# Patient Record
Sex: Male | Born: 1990 | Race: Black or African American | Hispanic: No | Marital: Single | Smoking: Never smoker
Health system: Southern US, Community
[De-identification: ages and names within clinical notes are randomized; demographics above are authoritative.]

## PROBLEM LIST (undated history)

## (undated) DIAGNOSIS — K819 Cholecystitis, unspecified: Secondary | ICD-10-CM

## (undated) SURGERY — ERCP, WITH INTERVENTION IF INDICATED
Anesthesia: Monitor Anesthesia Care

---
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
## 2010-12-17 ENCOUNTER — Emergency Department (HOSPITAL_COMMUNITY)
Admission: EM | Admit: 2010-12-17 | Discharge: 2010-12-17 | Disposition: A | Discharge status: Home / Self Care | Payer: Self-pay | Attending: Emergency Medicine | Admitting: Emergency Medicine

## 2010-12-17 DIAGNOSIS — R350 Frequency of micturition: Secondary | ICD-10-CM | POA: Insufficient documentation

## 2010-12-17 LAB — URINALYSIS, ROUTINE W REFLEX MICROSCOPIC
Nitrite: NEGATIVE
Urobilinogen, UA: 0.2 mg/dL (ref 0.0–1.0)

## 2010-12-17 LAB — CBC
HCT: 41.3 % (ref 39.0–52.0)
Hemoglobin: 15 g/dL (ref 13.0–17.0)
MCHC: 36.3 g/dL — ABNORMAL HIGH (ref 30.0–36.0)
RDW: 13.8 % (ref 11.5–15.5)
WBC: 6.6 10*3/uL (ref 4.0–10.5)

## 2010-12-17 LAB — COMPREHENSIVE METABOLIC PANEL
AST: 64 U/L — ABNORMAL HIGH (ref 0–37)
Albumin: 4.3 g/dL (ref 3.5–5.2)
Alkaline Phosphatase: 106 U/L (ref 39–117)
CO2: 28 mEq/L (ref 19–32)
GFR calc non Af Amer: >60 mL/min (ref >60)
Sodium: 143 mEq/L (ref 135–145)

## 2010-12-17 LAB — DIFFERENTIAL
Basophils Absolute: 0.1 10*3/uL (ref 0.0–0.1)
Eosinophils Absolute: 1 10*3/uL — ABNORMAL HIGH (ref 0.0–0.7)
Lymphocytes Relative: 41 % (ref 12–46)
Lymphs Abs: 2.7 10*3/uL (ref 0.7–4.0)
Monocytes Absolute: 0.4 10*3/uL (ref 0.1–1.0)
Monocytes Relative: 7 % (ref 3–12)
Neutro Abs: 2.4 10*3/uL (ref 1.7–7.7)

## 2010-12-17 LAB — GLUCOSE, CAPILLARY: Glucose-Capillary: 118 mg/dL — ABNORMAL HIGH (ref 70–99)

## 2014-10-25 ENCOUNTER — Other Ambulatory Visit: Payer: Self-pay | Admitting: *Deleted

## 2014-10-25 ENCOUNTER — Other Ambulatory Visit: Payer: Self-pay

## 2014-10-25 DIAGNOSIS — IMO0001 Reserved for inherently not codable concepts without codable children: Secondary | ICD-10-CM

## 2015-03-29 ENCOUNTER — Ambulatory Visit (INDEPENDENT_AMBULATORY_CARE_PROVIDER_SITE_OTHER): Payer: 59 | Admitting: Physician Assistant

## 2015-03-29 VITALS — BP 120/80 | HR 69 | Temp 98.7°F | Resp 16 | Ht 69.0 in | Wt 227.0 lb

## 2015-03-29 DIAGNOSIS — R1011 Right upper quadrant pain: Secondary | ICD-10-CM

## 2015-03-29 NOTE — Progress Notes (Signed)
   Jason Villarreal  MRN: [id number] DOB: [birth year]  Subjective:  Pt presents to clinic RUQ cramping pain. Started 3 days ago and may be getting better but he just wants to make sure that it is nothing that he has to worry about.  When the pain started he also had nausea and vomiting but that has resolved.  He is back to eating and drinking normal.  He is having normal regular soft stools.  There is nothing that he has noticed that makes the pain worse or better.  He has tried nothing to help with the pain.  Yesterday the pain was much better.  He does not drink ETOH.  There are no active problems to display for this patient.   No current outpatient prescriptions on file prior to visit.   No current facility-administered medications on file prior to visit.    Allergies  Allergen Reactions  . Penicillins     Per mom since childhood    Review of Systems  Constitutional: Negative for fever and chills.  Gastrointestinal: Positive for vomiting (3 days ago - now resolved) and abdominal pain. Negative for nausea, diarrhea and constipation.  Genitourinary: Negative.    Objective:  BP 120/80 mmHg  Pulse 69  Temp(Src) 98.7 F (37.1 C) (Oral)  Resp 16  Ht  (1.753 m)  Wt 227 lb (102.967 kg)  BMI 33.51 kg/m2  SpO2 98%  Physical Exam  Constitutional: He is oriented to person, place, and time and well-developed, well-nourished, and in no distress.  HENT:  Head: Normocephalic and atraumatic.  Right Ear: External ear normal.  Left Ear: External ear normal.  Eyes: Conjunctivae are normal.  Non-icteric  Neck: Normal range of motion.  Cardiovascular: Normal rate, regular rhythm and normal heart sounds.   Pulmonary/Chest: Effort normal and breath sounds normal.  Abdominal: Soft. Bowel sounds are normal. There is no hepatosplenomegaly. There is tenderness (mild discomfort) in the right upper quadrant. There is no rigidity, no rebound, no guarding, no CVA tenderness and negative  Murphy's sign.  Neurological: He is alert and oriented to person, place, and time. Gait normal.  Skin: Skin is warm and dry.  No jaundice  Psychiatric: Mood, memory, affect and judgment normal.    Assessment and Plan :  RUQ pain  - most likely related to GI illness that seems to be improving.  Pt's exam is unremarkable and we discussed options - he would like to watch and wait.  If he is not improving over the next 3-5 days he will RTC for labs.  Benny Lennert PA-C  [facility] 03/29/2015 9:40 AM

## 2015-03-29 NOTE — Patient Instructions (Signed)
Drink enough water. Make sure your stool stays soft.  Recheck if the pain changes or does not resolve.

## 2015-04-02 ENCOUNTER — Emergency Department (INDEPENDENT_AMBULATORY_CARE_PROVIDER_SITE_OTHER)
Admission: EM | Admit: 2015-04-02 | Discharge: 2015-04-02 | Disposition: A | Discharge status: Home / Self Care | Payer: 59 | Source: Home / Self Care | Attending: Emergency Medicine | Admitting: Emergency Medicine

## 2015-04-02 ENCOUNTER — Encounter (HOSPITAL_COMMUNITY): Payer: Self-pay | Admitting: Emergency Medicine

## 2015-04-02 ENCOUNTER — Emergency Department (INDEPENDENT_AMBULATORY_CARE_PROVIDER_SITE_OTHER): Payer: 59

## 2015-04-02 DIAGNOSIS — R1011 Right upper quadrant pain: Secondary | ICD-10-CM

## 2015-04-02 DIAGNOSIS — K5901 Slow transit constipation: Secondary | ICD-10-CM

## 2015-04-02 NOTE — ED Notes (Signed)
Lower abdominal pain, onset Sunday 9/18

## 2015-04-02 NOTE — ED Provider Notes (Signed)
CSN: [id number]     Arrival date & time 04/02/15  1853 History   First MD Initiated Contact with Patient 04/02/15 1906     Chief Complaint  Patient presents with  . Abdominal Pain   (Consider location/radiation/quality/duration/timing/severity/associated sxs/prior Treatment) HPI Comments: 24 year old male states that approximately 8 days ago he developed insidious onset of right upper quadrant pain. The pain has been constant and occasionally radiates to the right lower back. He describes it as a throbbing type pain it is worse when lying on the right side. Nothing else makes it worse. He states it is constant. Nothing makes it better. There is been no associated bleeding. He was seen by another urgent care his evaluation determined likely from a gas or stool type problem. He was advised that if the pain continued or develop new symptoms or problems to return for additional evaluation to include lab work. He said he was too busy Friday and Saturday so he waited till today to come to the urgent care. The pain got worse yesterday but states he had too many errands to run to get checked. He rates it as a 7/10.   History reviewed. No pertinent past medical history. History reviewed. No pertinent past surgical history. Family History  Problem Relation Age of Onset  . Diabetes Father    Social History  Substance Use Topics  . Smoking status: Never Smoker   . Smokeless tobacco: None  . Alcohol Use: No    Review of Systems  Constitutional: Negative for fever, activity change and fatigue.  HENT: Negative.   Respiratory: Negative for cough, choking, chest tightness and shortness of breath.   Cardiovascular: Negative for chest pain and palpitations.  Gastrointestinal: Positive for abdominal pain. Negative for nausea, vomiting, diarrhea, constipation and blood in stool.  Genitourinary: Negative.   Musculoskeletal: Positive for back pain. Negative for myalgias, joint swelling and gait problem.   Skin: Negative for color change and rash.  Neurological: Negative.   All other systems reviewed and are negative.   Allergies  Penicillins  Home Medications   Prior to Admission medications   Not on File   Meds Ordered and Administered this Visit  Medications - No data to display  BP 91/53 mmHg  Pulse 57  Temp(Src) 98.3 F (36.8 C) (Oral)  Resp 18  SpO2 100% No data found.   Physical Exam  Constitutional: He is oriented to person, place, and time. He appears well-developed and well-nourished. No distress.  Appears generally well. Is in no acute distress. Relaxed posturing. No facial expression of pain or distress. Voice is calm and relaxed  Neck: Normal range of motion. Neck supple.  Cardiovascular: Normal rate.   Pulmonary/Chest: Effort normal and breath sounds normal. No respiratory distress.  Abdominal: Soft. Bowel sounds are normal. He exhibits no distension and no mass.  Tenderness in a small area of the right upper quadrant. Percussion reveals generalized dullness. No rebound. Some guarding with initial palpation of the right upper quadrant. No tenderness to the left upper quadrant or the lower abdomen.  Musculoskeletal: Normal range of motion. He exhibits no edema.  Neurological: He is alert and oriented to person, place, and time. He exhibits normal muscle tone.  Skin: Skin is warm and dry.  Psychiatric: He has a normal mood and affect.  Nursing note and vitals reviewed.   ED Course  Procedures (including critical care time)  Labs Review Labs Reviewed - No data to display  Imaging Review Dg Abd 1 View  04/02/2015   CLINICAL DATA:  Right upper quadrant pain for 1 week with nausea.  EXAM: ABDOMEN - 1 VIEW  COMPARISON:  None.  FINDINGS: The bowel gas pattern is normal. No radio-opaque calculi or other significant radiographic abnormality are seen.  IMPRESSION: Negative exam.   Electronically Signed   By: Drusilla Kanner M.D.   On: 04/02/2015 19:38      Visual Acuity Review  Right Eye Distance:   Left Eye Distance:   Bilateral Distance:    Right Eye Near:   Left Eye Near:    Bilateral Near:         MDM   1. Right upper quadrant pain   2. Slow transit constipation    Try the Miralax as directed. Start with 2 glasses/doses tonight. May need to repeat tomorrow depending on results.  If not improving or worse see your doctor or go to the ED  Xray with large stool mass RUQ and stool throughout the large colon. Abd with no peritoneal signs. Pt stable, no distress, minimal abd discomfort.  Hayden Rasmussen, NP 04/02/15 2001

## 2015-04-02 NOTE — Discharge Instructions (Signed)
Constipation Try the Miralax as directed. Start with 2 glasses/doses tonight. May need to repeat tomorrow depending on results.  If not improving or worse see your doctor or go to the ED Constipation is when a person has fewer than three bowel movements a week, has difficulty having a bowel movement, or has stools that are dry, hard, or larger than normal. As people grow older, constipation is more common. If you try to fix constipation with medicines that make you have a bowel movement (laxatives), the problem may get worse. Long-term laxative use may cause the muscles of the colon to become weak. A low-fiber diet, not taking in enough fluids, and taking certain medicines may make constipation worse.  CAUSES   Certain medicines, such as antidepressants, pain medicine, iron supplements, antacids, and water pills.   Certain diseases, such as diabetes, irritable bowel syndrome (IBS), thyroid disease, or depression.   Not drinking enough water.   Not eating enough fiber-rich foods.   Stress or travel.   Lack of physical activity or exercise.   Ignoring the urge to have a bowel movement.   Using laxatives too much.  SIGNS AND SYMPTOMS   Having fewer than three bowel movements a week.   Straining to have a bowel movement.   Having stools that are hard, dry, or larger than normal.   Feeling full or bloated.   Pain in the lower abdomen.   Not feeling relief after having a bowel movement.  DIAGNOSIS  Your health care provider will take a medical history and perform a physical exam. Further testing may be done for severe constipation. Some tests may include:  A barium enema X-ray to examine your rectum, colon, and, sometimes, your small intestine.   A sigmoidoscopy to examine your lower colon.   A colonoscopy to examine your entire colon. TREATMENT  Treatment will depend on the severity of your constipation and what is causing it. Some dietary treatments include  drinking more fluids and eating more fiber-rich foods. Lifestyle treatments may include regular exercise. If these diet and lifestyle recommendations do not help, your health care provider may recommend taking over-the-counter laxative medicines to help you have bowel movements. Prescription medicines may be prescribed if over-the-counter medicines do not work.  HOME CARE INSTRUCTIONS   Eat foods that have a lot of fiber, such as fruits, vegetables, whole grains, and beans.  Limit foods high in fat and processed sugars, such as french fries, hamburgers, cookies, candies, and soda.   A fiber supplement may be added to your diet if you cannot get enough fiber from foods.   Drink enough fluids to keep your urine clear or pale yellow.   Exercise regularly or as directed by your health care provider.   Go to the restroom when you have the urge to go. Do not hold it.   Only take over-the-counter or prescription medicines as directed by your health care provider. Do not take other medicines for constipation without talking to your health care provider first.  SEEK IMMEDIATE MEDICAL CARE IF:   You have bright red blood in your stool.   Your constipation lasts for more than 4 days or gets worse.   You have abdominal or rectal pain.   You have thin, pencil-like stools.   You have unexplained weight loss. MAKE SURE YOU:   Understand these instructions.  Will watch your condition.  Will get help right away if you are not doing well or get worse. Document Released: 03/22/2004 Document Revised:  06/29/2013 Document Reviewed: 04/05/2013 Exit[employer] Patient Information 2015 [place], [place]. This information is not intended to replace advice given to you by your health care provider. Make sure you discuss any questions you have with your health care provider.  Abdominal Pain Many things can cause abdominal pain. Usually, abdominal pain is not caused by a disease and will improve without  treatment. It can often be observed and treated at home. Your health care provider will do a physical exam and possibly order blood tests and X-rays to help determine the seriousness of your pain. However, in many cases, more time must pass before a clear cause of the pain can be found. Before that point, your health care provider may not know if you need more testing or further treatment. HOME CARE INSTRUCTIONS  Monitor your abdominal pain for any changes. The following actions may help to alleviate any discomfort you are experiencing:  Only take over-the-counter or prescription medicines as directed by your health care provider.  Do not take laxatives unless directed to do so by your health care provider.  Try a clear liquid diet (broth, tea, or water) as directed by your health care provider. Slowly move to a bland diet as tolerated. SEEK MEDICAL CARE IF:  You have unexplained abdominal pain.  You have abdominal pain associated with nausea or diarrhea.  You have pain when you urinate or have a bowel movement.  You experience abdominal pain that wakes you in the night.  You have abdominal pain that is worsened or improved by eating food.  You have abdominal pain that is worsened with eating fatty foods.  You have a fever. SEEK IMMEDIATE MEDICAL CARE IF:   Your pain does not go away within 2 hours.  You keep throwing up (vomiting).  Your pain is felt only in portions of the abdomen, such as the right side or the left lower portion of the abdomen.  You pass bloody or black tarry stools. MAKE SURE YOU:  Understand these instructions.   Will watch your condition.   Will get help right away if you are not doing well or get worse.  Document Released: 04/03/2005 Document Revised: 06/29/2013 Document Reviewed: 03/03/2013 [facility] Patient Information 2015 [place], [place]. This information is not intended to replace advice given to you by your health care provider. Make sure you  discuss any questions you have with your health care provider.

## 2015-04-13 ENCOUNTER — Telehealth: Payer: Self-pay

## 2015-04-13 ENCOUNTER — Ambulatory Visit (INDEPENDENT_AMBULATORY_CARE_PROVIDER_SITE_OTHER): Payer: 59 | Admitting: Family Medicine

## 2015-04-13 VITALS — BP 98/76 | HR 97 | Temp 98.5°F | Resp 16 | Ht 69.5 in | Wt 219.0 lb

## 2015-04-13 DIAGNOSIS — K5901 Slow transit constipation: Secondary | ICD-10-CM | POA: Diagnosis not present

## 2015-04-13 DIAGNOSIS — R112 Nausea with vomiting, unspecified: Secondary | ICD-10-CM | POA: Diagnosis not present

## 2015-04-13 DIAGNOSIS — R1011 Right upper quadrant pain: Secondary | ICD-10-CM | POA: Diagnosis not present

## 2015-04-13 LAB — POCT CBC
Granulocyte percent: 70.5 %G (ref 37–80)
HEMATOCRIT: 46.3 % (ref 43.5–53.7)
HEMOGLOBIN: 15 g/dL (ref 14.1–18.1)
LYMPH, POC: 1.5 (ref 0.6–3.4)
MCH: 25.7 pg — AB (ref 27–31.2)
MCHC: 32.3 g/dL (ref 31.8–35.4)
MCV: 79.4 fL — AB (ref 80–97)
MID (cbc): 0.6 (ref 0–0.9)
MPV: 8.6 fL (ref 0–99.8)
POC GRANULOCYTE: 5.1 (ref 2–6.9)
POC LYMPH PERCENT: 20.5 %L (ref 10–50)
POC MID %: 9 %M (ref 0–12)
Platelet Count, POC: 359 10*3/uL (ref 142–424)
RBC: 5.83 M/uL (ref 4.69–6.13)
RDW, POC: 14.2 %
WBC: 7.2 10*3/uL (ref 4.6–10.2)

## 2015-04-13 LAB — POCT URINALYSIS DIP (MANUAL ENTRY)
GLUCOSE UA: NEGATIVE
LEUKOCYTES UA: NEGATIVE
NITRITE UA: NEGATIVE
Protein Ur, POC: NEGATIVE
Spec Grav, UA: <=1.005
UROBILINOGEN UA: 0.2
pH, UA: 6.5

## 2015-04-13 LAB — COMPREHENSIVE METABOLIC PANEL
ALBUMIN: 5.1 g/dL (ref 3.6–5.1)
ALK PHOS: 334 U/L — AB (ref 40–115)
ALT: 569 U/L — AB (ref 9–46)
AST: 381 U/L — AB (ref 10–40)
BILIRUBIN TOTAL: 8.1 mg/dL — AB (ref 0.2–1.2)
BUN: 6 mg/dL — ABNORMAL LOW (ref 7–25)
CALCIUM: 10.4 mg/dL — AB (ref 8.6–10.3)
CO2: 34 mmol/L — ABNORMAL HIGH (ref 20–31)
CREATININE: 1.09 mg/dL (ref 0.60–1.35)
Chloride: 93 mmol/L — ABNORMAL LOW (ref 98–110)
Glucose, Bld: 91 mg/dL (ref 65–99)
Potassium: 4.1 mmol/L (ref 3.5–5.3)
SODIUM: 137 mmol/L (ref 135–146)
TOTAL PROTEIN: 8.3 g/dL — AB (ref 6.1–8.1)

## 2015-04-13 LAB — POC MICROSCOPIC URINALYSIS (UMFC): MUCUS RE: ABSENT

## 2015-04-13 LAB — LIPASE: LIPASE: 14 U/L (ref 7–60)

## 2015-04-13 MED ORDER — ONDANSETRON 4 MG PO TBDP: 4.0000 mg | ORAL_TABLET | Freq: Three times a day (TID) | ORAL | Status: DC | PRN

## 2015-04-13 MED ORDER — DICYCLOMINE HCL 10 MG PO CAPS: ORAL_CAPSULE | ORAL | Status: DC

## 2015-04-13 NOTE — Progress Notes (Signed)
Patient ID: CHER EGNOR, male    DOB: [birth year]  Age: 24 y.o. MRN: [id number]  Chief Complaint  Patient presents with  . Follow-up    right sided abd pain-not any better  . Vomiting    x 2 days    Subjective:   24 year old man who is here complaining of right upper quadrant abdominal pain. He was here several weeks ago and evaluated. Was felt to have been a transient thing that it mostly past by then. Later he had more problems and went to another physician. An x-ray of the abdomen was done and he was told he was constipated. He took some MiraLAX. His bowels still don't remove regularly. He did not have a BM yesterday. He has had nausea and some vomiting. He hurts in the right upper quadrant. It hurts everyday fairly persistently. He has not been eating much because of it. He hurts in his right flank area at times. He's not been running any fever. He has no dysuria but his urine is been dark. His bowels do not move yesterday. He has never had any surgeries on his abdomen. There is no family history of gallbladder disease.  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 He mostly eats out for his meals.  Current allergies, medications, problem list, past/family and social histories reviewed.  Objective:  BP 98/76 mmHg  Pulse 97  Temp(Src) 98.5 F (36.9 C)  Resp 16  Ht 5' 9.5" (1.765 m)  Wt 219 lb (99.338 kg)  BMI 31.89 kg/m2  SpO2 98%  Moderately overweight young man in no acute distress. Chest clear. Heart regular without murmurs. Abdomen soft without masses. Mild right quadrant tenderness. No rebound. No CVA tenderness. Genitorectal exam not done.  Assessment & Plan:   Assessment: 1. Continuous RUQ abdominal pain   2. Slow transit constipation   3. Non-intractable vomiting with nausea, unspecified vomiting type       Plan:  Check CBC and urine. Results for orders placed or performed in visit on 04/13/15  POCT CBC   Result Value Ref Range   WBC 7.2 4.6 - 10.2 K/uL   Lymph, poc 1.5 0.6 - 3.4   POC LYMPH PERCENT 20.5 10 - 50 %L   MID (cbc) 0.6 0 - 0.9   POC MID % 9.0 0 - 12 %M   POC Granulocyte 5.1 2 - 6.9   Granulocyte percent 70.5 37 - 80 %G   RBC 5.83 4.69 - 6.13 M/uL   Hemoglobin 15.0 14.1 - 18.1 g/dL   HCT, POC 78.4 69.6 - 53.7 %   MCV 79.4 (A) 80 - 97 fL   MCH, POC 25.7 (A) 27 - 31.2 pg   MCHC 32.3 31.8 - 35.4 g/dL   RDW, POC 29.5 %   Platelet Count, POC 359 142 - 424 K/uL   MPV 8.6 0 - 99.8 fL  POCT Microscopic Urinalysis ([facility])  Result Value Ref Range   WBC,UR,HPF,POC None None WBC/hpf   RBC,UR,HPF,POC None None RBC/hpf   Bacteria None None   Mucus Absent Absent   Epithelial Cells, UR Per Microscopy None None cells/hpf  POCT urinalysis dipstick  Result Value Ref Range   Color, UA other (A) yellow   Clarity, UA clear clear   Glucose, UA negative negative   Bilirubin, UA moderate (A) negative   Ketones, POC UA moderate (40) (A) negative   Spec Grav, UA <=1.005    Blood, UA trace-intact (A)  negative   pH, UA 6.5    Protein Ur, POC negative negative   Urobilinogen, UA 0.2    Nitrite, UA Negative Negative   Leukocytes, UA Negative Negative        Patient Instructions  Drink more fluids. You are getting dehydrated  Your additional labs will be back in the next day or 2 and I will let you know the results of them  Dehydration will aggravate her constipation also. It is important that in addition to drinking more fluids you go ahead and take the MiraLAX if you have not had a bowel movement to keep your bowels moving.  Take dicyclomine 10 mg before meals and at bedtime if needed for pain  You're being scheduled for a gallbladder ultrasound which should be done in the next several days. If you get appropriate worse in the meanwhile before then please slow.  Take ondansetron 4 mg every 6 or 8 hours if needed for nausea or vomiting  If acutely worse at any time either return  here or go to the emergency room.  If not improved in the next week to return for a recheck.     No Follow-up on file.   HOPPER,DAVID, MD 04/13/2015

## 2015-04-13 NOTE — Telephone Encounter (Signed)
Tried to call Pt. No answer,left VM to call back. Dr. Quintella Reichert wanted him to know that his liver test shows some high elevation and he suspects that his gallbladder is infected and wants him to go to the ER today.

## 2015-04-13 NOTE — Patient Instructions (Signed)
Drink more fluids. You are getting dehydrated  Your additional labs will be back in the next day or 2 and I will let you know the results of them  Dehydration will aggravate her constipation also. It is important that in addition to drinking more fluids you go ahead and take the MiraLAX if you have not had a bowel movement to keep your bowels moving.  Take dicyclomine 10 mg before meals and at bedtime if needed for pain  You're being scheduled for a gallbladder ultrasound which should be done in the next several days. If you get appropriate worse in the meanwhile before then please slow.  Take ondansetron 4 mg every 6 or 8 hours if needed for nausea or vomiting  If acutely worse at any time either return here or go to the emergency room.  If not improved in the next week to return for a recheck.

## 2015-04-14 ENCOUNTER — Emergency Department (HOSPITAL_COMMUNITY): Payer: 59

## 2015-04-14 ENCOUNTER — Encounter (HOSPITAL_COMMUNITY): Payer: Self-pay | Admitting: Emergency Medicine

## 2015-04-14 ENCOUNTER — Inpatient Hospital Stay (HOSPITAL_COMMUNITY)
Admission: EM | Admit: 2015-04-14 | Discharge: 2015-04-18 | DRG: 419 | Disposition: A | Discharge status: Home / Self Care | Payer: 59 | Attending: General Surgery | Admitting: General Surgery

## 2015-04-14 DIAGNOSIS — R000 Tachycardia, unspecified: Secondary | ICD-10-CM | POA: Diagnosis not present

## 2015-04-14 DIAGNOSIS — Z880 Allergy status to penicillin: Secondary | ICD-10-CM | POA: Diagnosis not present

## 2015-04-14 DIAGNOSIS — Z833 Family history of diabetes mellitus: Secondary | ICD-10-CM

## 2015-04-14 DIAGNOSIS — Z79899 Other long term (current) drug therapy: Secondary | ICD-10-CM | POA: Diagnosis not present

## 2015-04-14 DIAGNOSIS — R739 Hyperglycemia, unspecified: Secondary | ICD-10-CM | POA: Diagnosis present

## 2015-04-14 DIAGNOSIS — K8066 Calculus of gallbladder and bile duct with acute and chronic cholecystitis without obstruction: Secondary | ICD-10-CM | POA: Diagnosis not present

## 2015-04-14 DIAGNOSIS — R1011 Right upper quadrant pain: Secondary | ICD-10-CM | POA: Diagnosis present

## 2015-04-14 DIAGNOSIS — K8042 Calculus of bile duct with acute cholecystitis without obstruction: Secondary | ICD-10-CM

## 2015-04-14 DIAGNOSIS — Z9049 Acquired absence of other specified parts of digestive tract: Secondary | ICD-10-CM | POA: Diagnosis present

## 2015-04-14 DIAGNOSIS — K8050 Calculus of bile duct without cholangitis or cholecystitis without obstruction: Secondary | ICD-10-CM

## 2015-04-14 DIAGNOSIS — K8020 Calculus of gallbladder without cholecystitis without obstruction: Secondary | ICD-10-CM

## 2015-04-14 LAB — COMPREHENSIVE METABOLIC PANEL
ALBUMIN: 4.7 g/dL (ref 3.5–5.0)
ALK PHOS: 360 U/L — AB (ref 38–126)
ALT: 501 U/L — ABNORMAL HIGH (ref 17–63)
ANION GAP: 12 (ref 5–15)
AST: 298 U/L — ABNORMAL HIGH (ref 15–41)
BILIRUBIN TOTAL: 7.2 mg/dL — AB (ref 0.3–1.2)
BUN: 5 mg/dL — AB (ref 6–20)
CALCIUM: 9.6 mg/dL (ref 8.9–10.3)
CO2: 27 mmol/L (ref 22–32)
Chloride: 95 mmol/L — ABNORMAL LOW (ref 101–111)
Creatinine, Ser: 0.81 mg/dL (ref 0.61–1.24)
GFR calc Af Amer: >60 mL/min (ref >60)
GLUCOSE: 180 mg/dL — AB (ref 65–99)
Potassium: 3.6 mmol/L (ref 3.5–5.1)
Sodium: 134 mmol/L — ABNORMAL LOW (ref 135–145)
TOTAL PROTEIN: 8.4 g/dL — AB (ref 6.5–8.1)

## 2015-04-14 LAB — URINALYSIS, ROUTINE W REFLEX MICROSCOPIC
GLUCOSE, UA: NEGATIVE mg/dL
HGB URINE DIPSTICK: NEGATIVE
KETONES UR: NEGATIVE mg/dL
Leukocytes, UA: NEGATIVE
Nitrite: NEGATIVE
PROTEIN: NEGATIVE mg/dL
Specific Gravity, Urine: 1.006 (ref 1.005–1.030)
Urobilinogen, UA: 0.2 mg/dL (ref 0.0–1.0)
pH: 7 (ref 5.0–8.0)

## 2015-04-14 LAB — CBC
HCT: 40.9 % (ref 39.0–52.0)
HEMOGLOBIN: 15 g/dL (ref 13.0–17.0)
MCH: 27.4 pg (ref 26.0–34.0)
MCHC: 36.7 g/dL — AB (ref 30.0–36.0)
MCV: 74.8 fL — ABNORMAL LOW (ref 78.0–100.0)
Platelets: 349 10*3/uL (ref 150–400)
RBC: 5.47 MIL/uL (ref 4.22–5.81)
RDW: 13.4 % (ref 11.5–15.5)
WBC: 10.2 10*3/uL (ref 4.0–10.5)

## 2015-04-14 LAB — BILIRUBIN, DIRECT: BILIRUBIN DIRECT: 4.1 mg/dL — AB (ref 0.1–0.5)

## 2015-04-14 LAB — LIPASE, BLOOD: Lipase: 25 U/L (ref 22–51)

## 2015-04-14 MED ORDER — HYDROMORPHONE HCL 1 MG/ML IJ SOLN
1.0000 mg | Freq: Once | INTRAMUSCULAR | Status: AC
  Administered 2015-04-14: 1 mg via INTRAVENOUS
  Filled 2015-04-14: qty 1

## 2015-04-14 MED ORDER — SODIUM CHLORIDE 0.9 % IV BOLUS (SEPSIS)
1000.0000 mL | Freq: Once | INTRAVENOUS | Status: AC
  Administered 2015-04-14: 1000 mL via INTRAVENOUS

## 2015-04-14 MED ORDER — ONDANSETRON HCL 4 MG/2ML IJ SOLN
4.0000 mg | Freq: Once | INTRAMUSCULAR | Status: AC
  Administered 2015-04-14: 4 mg via INTRAVENOUS
  Filled 2015-04-14: qty 2

## 2015-04-14 MED ORDER — MORPHINE SULFATE (PF) 4 MG/ML IV SOLN
4.0000 mg | Freq: Once | INTRAVENOUS | Status: AC
  Administered 2015-04-14: 4 mg via INTRAVENOUS
  Filled 2015-04-14: qty 1

## 2015-04-14 NOTE — ED Notes (Signed)
Pt reports RUQ abd pain for the past 3 weeks. Pt went to UC and was told he had elevated liver enzymes. Was told to come here due to concern for gallbladder issues.

## 2015-04-14 NOTE — ED Notes (Signed)
Pt reports occasional bursts of pain but states that right now it is "manageable" and denies the need for additional pain medication at this time.  He is resting comfortably in bed with family present at the bedside.  No acute distress noted.

## 2015-04-14 NOTE — ED Provider Notes (Signed)
CSN: [id number]     Arrival date & time 04/14/15  1555 History   First MD Initiated Contact with Patient 04/14/15 1729     Chief Complaint  Patient presents with  . Abdominal Pain     (Consider location/radiation/quality/duration/timing/severity/associated sxs/prior Treatment) HPI Comments: 24 year old male who presents with right upper quadrant pain. Patient states that he has had 3 weeks of worsening right upper quadrant pain that is now constant, severe, and radiates to his back and right shoulder. Nothing makes the pain better or worse. He denies any association of the pain with eating. He was evaluated several weeks ago and diagnosed with constipation. He has taken some medications to treat constipation but his pain is not improved. He went to an urgent care recently and was later called about abnormal labs-- he was told that his liver enzymes were elevated. He denies any significant alcohol or Tylenol use. No fevers or cough/cold symptoms. He has had several episodes of vomiting, most recently this morning. No diarrhea or blood in his stool. Mild constipation.  Patient is a 24 y.o. male presenting with abdominal pain. The history is provided by the patient.  Abdominal Pain   History reviewed. No pertinent past medical history. History reviewed. No pertinent past surgical history. Family History  Problem Relation Age of Onset  . Diabetes Father    Social History  Substance Use Topics  . Smoking status: Never Smoker   . Smokeless tobacco: None  . Alcohol Use: No    Review of Systems  Gastrointestinal: Positive for abdominal pain.    10 Systems reviewed and are negative for acute change except as noted in the HPI.   Allergies  Penicillins  Home Medications   Prior to Admission medications   Medication Sig Start Date End Date Taking? Authorizing Provider  dicyclomine (BENTYL) 10 MG capsule Take 1 pill before meals and at bedtime only if needed for abdominal pain. 04/13/15   Yes Peyton Najjar, MD  ondansetron (ZOFRAN ODT) 4 MG disintegrating tablet Take 1 tablet (4 mg total) by mouth every 8 (eight) hours as needed for nausea or vomiting. 04/13/15  Yes Peyton Najjar, MD   BP 106/55 mmHg  Pulse 91  Temp(Src) 98.1 F (36.7 C) (Oral)  Resp 16  SpO2 98% Physical Exam  Constitutional: He is oriented to person, place, and time. He appears well-developed and well-nourished.  In moderate distress due to pain  HENT:  Head: Normocephalic and atraumatic.  Mouth/Throat: Oropharynx is clear and moist.  Moist mucous membranes  Eyes: Pupils are equal, round, and reactive to light.  Scleral icterus  Neck: Neck supple.  Cardiovascular: Normal rate, regular rhythm and normal heart sounds.   No murmur heard. Pulmonary/Chest: Effort normal and breath sounds normal.  Abdominal: Soft. Bowel sounds are normal. He exhibits no distension.  Tenderness to palpation of RUQ w/ mild voluntary guarding; +hepatomegaly w/ liver edge felt 3-4cm below costal margin; no peritonitis  Musculoskeletal: He exhibits no edema.  Neurological: He is alert and oriented to person, place, and time.  Fluent speech  Skin: Skin is warm and dry.  Psychiatric: He has a normal mood and affect. Judgment normal.  Nursing note and vitals reviewed.   ED Course  Procedures (including critical care time) Labs Review Labs Reviewed  COMPREHENSIVE METABOLIC PANEL - Abnormal; Notable for the following:    Sodium 134 (*)    Chloride 95 (*)    Glucose, Bld 180 (*)    BUN 5 (*)  Total Protein 8.4 (*)    AST 298 (*)    ALT 501 (*)    Alkaline Phosphatase 360 (*)    Total Bilirubin 7.2 (*)    All other components within normal limits  CBC - Abnormal; Notable for the following:    MCV 74.8 (*)    MCHC 36.7 (*)    All other components within normal limits  BILIRUBIN, DIRECT - Abnormal; Notable for the following:    Bilirubin, Direct 4.1 (*)    All other components within normal limits  LIPASE, BLOOD   URINALYSIS, ROUTINE W REFLEX MICROSCOPIC (NOT AT [facility])    Imaging Review US Abdomen Complete  04/14/2015   CLINICAL DATA:  Right upper quadrant pain and increased liver function tests for 3 weeks.  EXAM: ULTRASOUND ABDOMEN COMPLETE  COMPARISON:  None.  FINDINGS: Gallbladder: The gallbladder is normally distended, partially filled with multiple mobile shadowing gallstones measuring up to 6 mm. The gallbladder wall is thickened and edematous measuring up to 6 mm. Sonographic Murphy's sign is positive.  Common bile duct: Diameter: Enlarged to 8.5 mm.  Liver: No focal lesion identified. Within normal limits in parenchymal echogenicity.  IVC: No abnormality visualized.  Pancreas: Visualized portion unremarkable, however partially obscured by bowel gas.  Spleen: Size and appearance within normal limits.  Right Kidney: Length: 11.9 cm. Echogenicity within normal limits. No mass or hydronephrosis visualized.  Left Kidney: Length: 11.4 cm. Echogenicity within normal limits. No mass or hydronephrosis visualized.  Abdominal aorta: Was not seen.  Other findings: None.  IMPRESSION: Cholelithiasis with evidence of acute cholecystitis.  Dilated common bile duct of 8.5 mm, which raises the possibility of concomitant choledocholithiasis.  These results were called by telephone at the time of interpretation on 04/14/2015 at 7:46 pm to Dr. Frederick Peers , who verbally acknowledged these results.   Electronically Signed   By: Ted Mcalpine M.D.   On: 04/14/2015 19:51   I have personally reviewed and evaluated these lab results as part of my medical decision-making.   EKG Interpretation None     Medications  sodium chloride 0.9 % bolus 1,000 mL (1,000 mLs Intravenous New Bag/Given 04/14/15 1824)  ondansetron (ZOFRAN) injection 4 mg (4 mg Intravenous Given 04/14/15 1824)  morphine 4 MG/ML injection 4 mg (4 mg Intravenous Given 04/14/15 1824)    MDM   Final diagnoses:  Choledocholithiasis with acute cholecystitis    24 year old male who presents with 3 weeks of worsening right upper quadrant pain associated with some diarrhea. Patient uncomfortable and in moderate distress due to pain at presentation. Vital signs unremarkable. He had significant right upper quadrant tenderness as well as hepatomegaly on exam. Obtained above labs and gave the patient Zofran, morphine, and an IV fluid bolus. Later gave several doses of dilaudid for ongoing pain.  Labs notable for AST 298, ALT 501, alkaline phosphatase 360, total bilirubin 7.2, direct bilirubin 4.1. Obtained right upper quadrant ultrasound due to concern for obstructive biliary process. RUQ showed acute cholecystitis with dilated common bile duct at 8.5 mm, suggestive of choledocholithiasis. I consult did Dr. Ezzard Standing with general surgery as well as Dr. Loreta Ave from gastroenterology. I appreciate their assistance with patient's care. Dr. Ezzard Standing will admit patient and Dr. Loreta Ave has placed patient on consult list for consideration of ERCP in the morning. Pt will be held NPO at midnight. Pt admitted for further care.    Laurence Spates, MD 04/14/15 [phone]

## 2015-04-14 NOTE — Progress Notes (Signed)
Patient listed as having [employer] insurance without a pcp.  EDCM spoke to patient at bedside.  [facility] provided patient with list of pcps who accept [facility] insurance within a 10 mile radius of patient's zip code [address]. Patient thankful for resources.  No further EDCM needs at this time.

## 2015-04-14 NOTE — H&P (Addendum)
Re:   Jason Villarreal DOB:   May 06, 1991 MRN:   [id number]   [facility] Admission  ASSESSMENT AND PLAN: 1.  Cholelithiasis with probable cholecystitis  Plan admission, NPO p MN, IVF, and repeat labs in AM.  Will need ERCP first - the cholecystectomy.   I discussed with the patient the indications and risks of gall bladder surgery.  The primary risks of gall bladder surgery include, but are not limited to, bleeding, infection, common bile duct injury, and open surgery.  There is also the risk that the patient may have continued symptoms after surgery.  We discussed the typical post-operative recovery course. I tried to answer the patient's questions.  He will need the ERCP first.  2.  Probable choledocholithiasis  Dr. Rex Kras to talk to GI on call ([facility]) to consider ERCP.  Chief Complaint  Patient presents with  . Abdominal Pain   REFERRING PHYSICIAN:  Dr. Theotis Burrow, [facility] ER  HISTORY OF PRESENT ILLNESS: Jason Villarreal is a 24 y.o. (DOB: 05-19-1991)  AA  male whose primary care physician is [name]. and comes to the [facility] ER today for abdominal pain. His girlfriend, Lance Coon, is with him.   He has had a 3 week history of right upper quadrant abdominal pain.  He has also had nausea and vomiting.  He has not had much of an appetite.  He noticed about 3 or 4 days ago, his urine turned dark.  He has no prior history of stomach, liver, pancreas, or colon disease.  He has had no prior abdominal surgery.  He thinks that a cousin had gall bladder disease, but no other family members.  He went to [facility] care yesterday.  They thought he had constipation, but when his labs came back with elevated LFT's, he was instructed to go to the [facility].  Abdominal US - 04/14/2015 - Cholelithiasis with evidence of acute cholecystitis.  Dilated common bile duct of 8.5 mm, which raises the possibility of concomitant choledocholithiasis.  T. Bili - 7.2, ALT - 501, AST -  298, Alk Phos - 360 - drawn on  04/14/2015 WBC - 10,200 on 04/14/2015   History reviewed. No pertinent past medical history.   History reviewed. No pertinent past surgical history.    No current facility-administered medications for this encounter.   Current Outpatient Prescriptions  Medication Sig Dispense Refill  . dicyclomine (BENTYL) 10 MG capsule Take 1 pill before meals and at bedtime only if needed for abdominal pain. 30 capsule 0  . ondansetron (ZOFRAN ODT) 4 MG disintegrating tablet Take 1 tablet (4 mg total) by mouth every 8 (eight) hours as needed for nausea or vomiting. 12 tablet 0      Allergies  Allergen Reactions  . Penicillins     Per mom since childhood Has patient had a PCN reaction causing immediate rash, facial/tongue/throat swelling, SOB or lightheadedness with hypotension: No Has patient had a PCN reaction causing severe rash involving mucus membranes or skin necrosis: No Has patient had a PCN reaction that required hospitalization No Has patient had a PCN reaction occurring within the last 10 years: No If all of the above answers are "NO", then may proceed with Cephalosporin use.     REVIEW OF SYSTEMS: Skin:  No history of rash.  No history of abnormal moles. Infection:  No history of hepatitis or HIV.  No history of MRSA. Neurologic:  No history of stroke.  No history of seizure.  No history of headaches. Cardiac:  No history of hypertension. No history of heart disease.    No history of seeing a cardiologist. Pulmonary:  Does not smoke cigarettes.  No asthma or bronchitis.  No OSA/CPAP.  Endocrine:  No diabetes. No thyroid disease. Gastrointestinal:  See HPI. Urologic:  No history of kidney stones.  No history of bladder infections. Musculoskeletal:  No history of joint or back disease. Hematologic:  No bleeding disorder.  No history of anemia.  Not anticoagulated. Psycho-social:  The patient is oriented.   The patient has no obvious psychologic or  social impairment to understanding our conversation and plan.  SOCIAL and FAMILY HISTORY: Unmarried. Lives with mother. Girlfriend, Lance Coon, is with him. Works at [employer] on [address].  They make Nyquil/Dayquil, etc.  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  PHYSICAL EXAM: BP 132/81 mmHg  Pulse 82  Temp(Src) 98.1 F (36.7 C) (Oral)  Resp 14  SpO2 99%  General: Slightly obese AA M who is alert. HEENT: Normal. Pupils equal.  His sclera are icteric. Neck: Supple. No mass.  No thyroid mass. Lymph Nodes:  No supraclavicular or cervical nodes. Lungs: Clear to auscultation and symmetric breath sounds. Heart:  RRR. No murmur or rub. Abdomen: Soft. No mass.  No hernia. Normal bowel sounds.  No abdominal scars.  Mild soreness in RUQ.  No peritoneal signs.  Extremities:  Good strength and ROM  in upper and lower extremities. Neurologic:  Grossly intact to motor and sensory function. Psychiatric: Has normal mood and affect. Behavior is normal.   DATA REVIEWED: Epic notes.  Alphonsa Overall, MD,  [facility], [address]  [place], [place]    [place] Phone:  [phone] FAX:  [phone]

## 2015-04-14 NOTE — ED Notes (Signed)
Pt given a urinal and asked to provide a urine specimen.

## 2015-04-15 ENCOUNTER — Encounter (HOSPITAL_COMMUNITY): Payer: Self-pay | Admitting: *Deleted

## 2015-04-15 ENCOUNTER — Inpatient Hospital Stay (HOSPITAL_COMMUNITY): Payer: 59

## 2015-04-15 ENCOUNTER — Encounter (HOSPITAL_COMMUNITY)
Admission: EM | Disposition: A | Discharge status: Home / Self Care | Payer: Self-pay | Source: Home / Self Care | Attending: Surgery

## 2015-04-15 ENCOUNTER — Inpatient Hospital Stay (HOSPITAL_COMMUNITY): Payer: 59 | Admitting: Anesthesiology

## 2015-04-15 LAB — CBC WITH DIFFERENTIAL/PLATELET
BASOS PCT: 0 %
Basophils Absolute: 0 10*3/uL (ref 0.0–0.1)
Eosinophils Absolute: 0 10*3/uL (ref 0.0–0.7)
Eosinophils Relative: 0 %
HEMATOCRIT: 41.5 % (ref 39.0–52.0)
Hemoglobin: 15 g/dL (ref 13.0–17.0)
Lymphocytes Relative: 14 %
Lymphs Abs: 1.1 10*3/uL (ref 0.7–4.0)
MCH: 27.2 pg (ref 26.0–34.0)
MCHC: 36.1 g/dL — AB (ref 30.0–36.0)
MCV: 75.2 fL — ABNORMAL LOW (ref 78.0–100.0)
MONO ABS: 0.5 10*3/uL (ref 0.1–1.0)
MONOS PCT: 7 %
NEUTROS ABS: 6.2 10*3/uL (ref 1.7–7.7)
Neutrophils Relative %: 79 %
Platelets: 355 10*3/uL (ref 150–400)
RBC: 5.52 MIL/uL (ref 4.22–5.81)
RDW: 13.7 % (ref 11.5–15.5)
WBC: 7.9 10*3/uL (ref 4.0–10.5)

## 2015-04-15 LAB — SURGICAL PCR SCREEN
MRSA, PCR: NEGATIVE
STAPHYLOCOCCUS AUREUS: POSITIVE — AB

## 2015-04-15 LAB — COMPREHENSIVE METABOLIC PANEL
ALBUMIN: 4.7 g/dL (ref 3.5–5.0)
ALT: 465 U/L — ABNORMAL HIGH (ref 17–63)
ANION GAP: 9 (ref 5–15)
AST: 252 U/L — ABNORMAL HIGH (ref 15–41)
Alkaline Phosphatase: 372 U/L — ABNORMAL HIGH (ref 38–126)
BILIRUBIN TOTAL: 7.2 mg/dL — AB (ref 0.3–1.2)
BUN: <5 mg/dL — ABNORMAL LOW (ref 6–20)
CO2: 31 mmol/L (ref 22–32)
Calcium: 9.3 mg/dL (ref 8.9–10.3)
Chloride: 101 mmol/L (ref 101–111)
Creatinine, Ser: 0.71 mg/dL (ref 0.61–1.24)
Glucose, Bld: 119 mg/dL — ABNORMAL HIGH (ref 65–99)
POTASSIUM: 4.1 mmol/L (ref 3.5–5.1)
Sodium: 141 mmol/L (ref 135–145)
TOTAL PROTEIN: 8.2 g/dL — AB (ref 6.5–8.1)

## 2015-04-15 SURGERY — ERCP, WITH INTERVENTION IF INDICATED
Anesthesia: General

## 2015-04-15 MED ORDER — LACTATED RINGERS IV SOLN
INTRAVENOUS | Status: DC | PRN
  Administered 2015-04-15: 13:00:00 via INTRAVENOUS

## 2015-04-15 MED ORDER — SUCCINYLCHOLINE CHLORIDE 20 MG/ML IJ SOLN
INTRAMUSCULAR | Status: DC | PRN
  Administered 2015-04-15: 100 mg via INTRAVENOUS

## 2015-04-15 MED ORDER — MUPIROCIN 2 % EX OINT
TOPICAL_OINTMENT | CUTANEOUS | Status: AC
  Filled 2015-04-15: qty 22

## 2015-04-15 MED ORDER — GLUCAGON HCL RDNA (DIAGNOSTIC) 1 MG IJ SOLR
INTRAMUSCULAR | Status: AC
  Filled 2015-04-15: qty 2

## 2015-04-15 MED ORDER — LIDOCAINE HCL (CARDIAC) 20 MG/ML IV SOLN
INTRAVENOUS | Status: DC | PRN
  Administered 2015-04-15: 100 mg via INTRAVENOUS

## 2015-04-15 MED ORDER — MIDAZOLAM HCL 2 MG/2ML IJ SOLN
INTRAMUSCULAR | Status: DC | PRN
Start: 2015-04-15 — End: 2015-04-15
  Administered 2015-04-15: 2 mg via INTRAVENOUS

## 2015-04-15 MED ORDER — PROPOFOL 10 MG/ML IV BOLUS
INTRAVENOUS | Status: DC | PRN
  Administered 2015-04-15: 200 mg via INTRAVENOUS
  Administered 2015-04-15: 70 mg via INTRAVENOUS

## 2015-04-15 MED ORDER — HYDROMORPHONE HCL 1 MG/ML IJ SOLN
1.0000 mg | Freq: Once | INTRAMUSCULAR | Status: AC
  Administered 2015-04-15: 1 mg via INTRAVENOUS
  Filled 2015-04-15: qty 1

## 2015-04-15 MED ORDER — IOHEXOL 300 MG/ML  SOLN
INTRAMUSCULAR | Status: DC | PRN
  Administered 2015-04-15: 15 mL

## 2015-04-15 MED ORDER — FENTANYL CITRATE (PF) 100 MCG/2ML IJ SOLN
INTRAMUSCULAR | Status: AC
  Filled 2015-04-15: qty 4

## 2015-04-15 MED ORDER — PROPOFOL 10 MG/ML IV BOLUS
INTRAVENOUS | Status: AC
  Filled 2015-04-15: qty 20

## 2015-04-15 MED ORDER — FENTANYL CITRATE (PF) 100 MCG/2ML IJ SOLN
INTRAMUSCULAR | Status: DC | PRN
  Administered 2015-04-15: 100 ug via INTRAVENOUS

## 2015-04-15 MED ORDER — ONDANSETRON 4 MG PO TBDP: 4.0000 mg | ORAL_TABLET | Freq: Four times a day (QID) | ORAL | Status: DC | PRN

## 2015-04-15 MED ORDER — ONDANSETRON HCL 4 MG/2ML IJ SOLN
4.0000 mg | Freq: Four times a day (QID) | INTRAMUSCULAR | Status: DC | PRN
  Administered 2015-04-16: 4 mg via INTRAVENOUS

## 2015-04-15 MED ORDER — MUPIROCIN 2 % EX OINT
1.0000 | TOPICAL_OINTMENT | Freq: Two times a day (BID) | CUTANEOUS | Status: DC
Start: 2015-04-15 — End: 2015-04-18
  Administered 2015-04-15 – 2015-04-18 (×6): 1 via NASAL

## 2015-04-15 MED ORDER — MORPHINE SULFATE (PF) 2 MG/ML IV SOLN
1.0000 mg | INTRAVENOUS | Status: DC | PRN
  Administered 2015-04-15 – 2015-04-16 (×5): 2 mg via INTRAVENOUS
  Filled 2015-04-15 (×5): qty 1

## 2015-04-15 MED ORDER — PROMETHAZINE HCL 25 MG/ML IJ SOLN: 6.2500 mg | INTRAMUSCULAR | Status: DC | PRN

## 2015-04-15 MED ORDER — SODIUM CHLORIDE 0.9 % IV SOLN: INTRAVENOUS | Status: DC

## 2015-04-15 MED ORDER — FENTANYL CITRATE (PF) 100 MCG/2ML IJ SOLN
25.0000 ug | INTRAMUSCULAR | Status: DC | PRN
  Administered 2015-04-16 (×2): 50 ug via INTRAVENOUS
  Administered 2015-04-16: 100 ug via INTRAVENOUS
  Administered 2015-04-16: 50 ug via INTRAVENOUS

## 2015-04-15 MED ORDER — HYDROCODONE-ACETAMINOPHEN 5-325 MG PO TABS
1.0000 | ORAL_TABLET | ORAL | Status: DC | PRN
  Administered 2015-04-16 – 2015-04-18 (×7): 2 via ORAL
  Filled 2015-04-15 (×7): qty 2

## 2015-04-15 MED ORDER — MIDAZOLAM HCL 2 MG/2ML IJ SOLN
INTRAMUSCULAR | Status: AC
  Filled 2015-04-15: qty 4

## 2015-04-15 MED ORDER — ONDANSETRON 4 MG PO TBDP: 4.0000 mg | ORAL_TABLET | Freq: Three times a day (TID) | ORAL | Status: DC | PRN

## 2015-04-15 MED ORDER — CHLORHEXIDINE GLUCONATE CLOTH 2 % EX PADS
6.0000 | MEDICATED_PAD | Freq: Every day | CUTANEOUS | Status: DC
  Administered 2015-04-17 – 2015-04-18 (×2): 6 via TOPICAL

## 2015-04-15 MED ORDER — POTASSIUM CHLORIDE IN NACL 20-0.45 MEQ/L-% IV SOLN
INTRAVENOUS | Status: DC
  Administered 2015-04-15: 17:00:00 via INTRAVENOUS
  Administered 2015-04-15: 125 mL/h via INTRAVENOUS
  Filled 2015-04-15 (×5): qty 1000

## 2015-04-15 MED ORDER — DEXTROSE 5 % IV SOLN
2.0000 g | INTRAVENOUS | Status: DC
  Administered 2015-04-15 – 2015-04-18 (×4): 2 g via INTRAVENOUS
  Filled 2015-04-15 (×4): qty 2

## 2015-04-15 MED ORDER — INDOMETHACIN 50 MG RE SUPP
100.0000 mg | Freq: Once | RECTAL | Status: AC
  Administered 2015-04-15: 100 mg via RECTAL
  Filled 2015-04-15: qty 2

## 2015-04-15 NOTE — Op Note (Signed)
[facility] [address] [place], [address]   ERCP PROCEDURE REPORT        EXAM DATE: April 25, 2015  PATIENT NAME:          Jason Villarreal, Jason Villarreal          MR #: [id number] BIRTHDATE:       1990-09-20     VISIT #:     [id number] ATTENDING:     Jeani Hawking, MD     STATUS:     inpatient ASSISTANT:      Nilsa Nutting, Monday, Sarah, and Anthony Sar   INDICATIONS:  The patient is a 24 yr old male here for an ERCP due to choledocholithiasis PROCEDURE PERFORMED:     ERCP with removal of calculus/calculi  MEDICATIONS:     General anesthesia  CONSENT: The patient understands the risks and benefits of the procedure and understands that these risks include, but are not limited to: sedation, allergic reaction, infection, perforation and/or bleeding. Alternative means of evaluation and treatment include, among others: physical exam, x-rays, and/or surgical intervention. The patient elects to proceed with this endoscopic procedure.  DESCRIPTION OF PROCEDURE: During intra-op preparation period all mechanical & medical equipment was checked for proper function. Hand hygiene and appropriate measures for infection prevention was taken. After the risks, benefits and alternatives of the procedure were thoroughly explained, Informed was verified, confirmed and timeout was successfully executed by the treatment team. With the patient in left semi-prone position, medications were administered intravenously.The    was passed from the mouth into the esophagus and further advanced from the esophagus into the stomach. From stomach scope was directed to the second portion of the duodenum. Major papilla was aligned with the duodenoscope. The scope position was confirmed fluoroscopically. Rest of the findings/therapeutics are given below. The scope was then completely withdrawn from the patient and the procedure completed. The pulse, BP, and O2 saturation were monitored and documented  by the physician and the nursing staff throughout the entire procedure. The patient was cared for as planned according to standard protocol. The patient was then discharged to recovery in stable condition and with appropriate post procedure care. Estimated blood loss is zero unless otherwise noted in this procedure report.  The ampulla was located the second portion of the duodenum and there was spontaneous drainage of bile.  The CBD was easily cannulated during the first attempt despite the large floppy papilla.  The guidewire was secured in the right intrahepatic ducts.  Contrast injection revealed a distal CBD stone and the CBD measured 8-9 mm. A 1 cm sphincterotomy was created and during this maneuver two small distal CBD stones were removed.  The CBD was swept three times and no further stones were extracted.  During the fourth pass, an occlusion cholangiogram was performed and there was no evidence of any retained stones.    ADVERSE EVENT:     No immediate. IMPRESSIONS:     1) Choledocholithiasis s/p successful stone extraction.  RECOMMENDATIONS:     1) Lap chle per Surgery. REPEAT EXAM:   ___________________________________ Jeani Hawking, MD eSigned:  Jeani Hawking, MD 2015-04-25 2:04 PM   cc:  CPT CODES: ICD9 CODES:

## 2015-04-15 NOTE — Progress Notes (Signed)
Dr. Loreta Ave contacted via phone to clarify IV NS @ 57ml/hr order. MD instructed to continue IVF w/K+ as ordered by [facility] prior to ERCP.

## 2015-04-15 NOTE — Progress Notes (Signed)
Patient ID: Jason Villarreal, male   DOB: 1990-10-04, 24 y.o.   MRN: [id number] Day of Surgery  Subjective: Denies abdominal pain or other complaints. Awaiting ERCP  Objective: Vital signs in last 24 hours: Temp:  [98.1 F (36.7 C)-98.9 F (37.2 C)] 98.9 F (37.2 C) (10/08 0400) Pulse Rate:  [70-91] 86 (10/08 0400) Resp:  [14-19] 18 (10/08 0400) BP: (106-137)/(55-85) 137/85 mmHg (10/08 0400) SpO2:  [94 %-99 %] 99 % (10/08 0400) Weight:  [97.387 kg (214 lb 11.2 oz)] 97.387 kg (214 lb 11.2 oz) (10/08 0400) Last BM Date: 04/14/15  Intake/Output from previous day:   Intake/Output this shift: Total I/O In: 727.1 [I.V.:727.1] Out: 1250 [Urine:1250]  Very comfortable and in no distress.  Lab Results:   Recent Labs  04/14/15 1649 04/15/15 0520  WBC 10.2 7.9  HGB 15.0 15.0  HCT 40.9 41.5  PLT 349 355   BMET  Recent Labs  04/14/15 1649 04/15/15 0520  NA 134* 141  K 3.6 4.1  CL 95* 101  CO2 27 31  GLUCOSE 180* 119*  BUN 5* <5*  CREATININE 0.81 0.71  CALCIUM 9.6 9.3     Studies/Results: US Abdomen Complete  04/14/2015   CLINICAL DATA:  Right upper quadrant pain and increased liver function tests for 3 weeks.  EXAM: ULTRASOUND ABDOMEN COMPLETE  COMPARISON:  None.  FINDINGS: Gallbladder: The gallbladder is normally distended, partially filled with multiple mobile shadowing gallstones measuring up to 6 mm. The gallbladder wall is thickened and edematous measuring up to 6 mm. Sonographic Murphy's sign is positive.  Common bile duct: Diameter: Enlarged to 8.5 mm.  Liver: No focal lesion identified. Within normal limits in parenchymal echogenicity.  IVC: No abnormality visualized.  Pancreas: Visualized portion unremarkable, however partially obscured by bowel gas.  Spleen: Size and appearance within normal limits.  Right Kidney: Length: 11.9 cm. Echogenicity within normal limits. No mass or hydronephrosis visualized.  Left Kidney: Length: 11.4 cm. Echogenicity within normal  limits. No mass or hydronephrosis visualized.  Abdominal aorta: Was not seen.  Other findings: None.  IMPRESSION: Cholelithiasis with evidence of acute cholecystitis.  Dilated common bile duct of 8.5 mm, which raises the possibility of concomitant choledocholithiasis.  These results were called by telephone at the time of interpretation on 04/14/2015 at 7:46 pm to Dr. Frederick Peers , who verbally acknowledged these results.   Electronically Signed   By: Ted Mcalpine M.D.   On: 04/14/2015 19:51    Anti-infectives: Anti-infectives    Start     Dose/Rate Route Frequency Ordered Stop   04/15/15 0400  cefTRIAXone (ROCEPHIN) 2 g in dextrose 5 % 50 mL IVPB     2 g 100 mL/hr over 30 Minutes Intravenous Every 24 hours 04/15/15 0330        Assessment/Plan: For ERCP today. Probable laparoscopic cholecystectomy tomorrow. Discussed with the patient and family.    LOS: 1 day    Moksh Loomer T 04/15/2015

## 2015-04-15 NOTE — Consult Note (Signed)
UNASSIGNED PATIENT Reason for Consult: RUQ pain with abnormal LFT's. Referring Physician: Dr. Domenic Schwab MD  Jason Villarreal is an 24 y.o. male.  HPI: 24 year old black male with a 3 week history of RUQ pain seen by a couple of physicians prior to his ER visit last night, told he was constipated and was started on Dicyclomine. He claims his symptoms worsened over the last few days with post-prandial nausea and vomiting and dark urine for the last 5 days. Labs done in the ER revealed hyperbilirubinemia with cholelithiasis and a dilated CBD upto 8.5 cm. He denies having any other GI complaints and has been healthy so far. There is no history of fever, chills or rigors.    History reviewed. No pertinent past medical history.  History reviewed. No pertinent past surgical history.  Family History  Problem Relation Age of Onset  . Diabetes Father    Social History:  reports that he has never smoked. He does not have any smokeless tobacco history on file. He reports that he does not drink alcohol. His drug history is not on file.  Allergies:  Allergies  Allergen Reactions  . Penicillins     Per mom since childhood Has patient had a PCN reaction causing immediate rash, facial/tongue/throat swelling, SOB or lightheadedness with hypotension: No Has patient had a PCN reaction causing severe rash involving mucus membranes or skin necrosis: No Has patient had a PCN reaction that required hospitalization No Has patient had a PCN reaction occurring within the last 10 years: No If all of the above answers are "NO", then may proceed with Cephalosporin use.    Medications: I have reviewed the patient's current medications.  Results for orders placed or performed during the hospital encounter of 04/14/15 (from the past 48 hour(s))  Lipase, blood     Status: None   Collection Time: 04/14/15  4:49 PM  Result Value Ref Range   Lipase 25 22 - 51 U/L  Comprehensive metabolic panel     Status: Abnormal    Collection Time: 04/14/15  4:49 PM  Result Value Ref Range   Sodium 134 (L) 135 - 145 mmol/L   Potassium 3.6 3.5 - 5.1 mmol/L   Chloride 95 (L) 101 - 111 mmol/L   CO2 27 22 - 32 mmol/L   Glucose, Bld 180 (H) 65 - 99 mg/dL   BUN 5 (L) 6 - 20 mg/dL   Creatinine, Ser 0.81 0.61 - 1.24 mg/dL   Calcium 9.6 8.9 - 10.3 mg/dL   Total Protein 8.4 (H) 6.5 - 8.1 g/dL   Albumin 4.7 3.5 - 5.0 g/dL   AST 298 (H) 15 - 41 U/L   ALT 501 (H) 17 - 63 U/L   Alkaline Phosphatase 360 (H) 38 - 126 U/L   Total Bilirubin 7.2 (H) 0.3 - 1.2 mg/dL   GFR calc non Af Amer >60 >60 mL/min   GFR calc Af Amer >60 >60 mL/min    Comment: (NOTE) The eGFR has been calculated using the CKD EPI equation. This calculation has not been validated in all clinical situations. eGFR's persistently <60 mL/min signify possible Chronic Kidney Disease.    Anion gap 12 5 - 15  CBC     Status: Abnormal   Collection Time: 04/14/15  4:49 PM  Result Value Ref Range   WBC 10.2 4.0 - 10.5 K/uL   RBC 5.47 4.22 - 5.81 MIL/uL   Hemoglobin 15.0 13.0 - 17.0 g/dL   HCT 40.9 39.0 -  52.0 %   MCV 74.8 (L) 78.0 - 100.0 fL   MCH 27.4 26.0 - 34.0 pg   MCHC 36.7 (H) 30.0 - 36.0 g/dL   RDW 13.4 11.5 - 15.5 %   Platelets 349 150 - 400 K/uL  Bilirubin, direct     Status: Abnormal   Collection Time: 04/14/15  4:49 PM  Result Value Ref Range   Bilirubin, Direct 4.1 (H) 0.1 - 0.5 mg/dL  Urinalysis, Routine w reflex microscopic     Status: Abnormal   Collection Time: 04/14/15  7:29 PM  Result Value Ref Range   Color, Urine YELLOW YELLOW   APPearance CLOUDY (A) CLEAR   Specific Gravity, Urine 1.006 1.005 - 1.030   pH 7.0 5.0 - 8.0   Glucose, UA NEGATIVE NEGATIVE mg/dL   Hgb urine dipstick NEGATIVE NEGATIVE   Bilirubin Urine SMALL (A) NEGATIVE   Ketones, ur NEGATIVE NEGATIVE mg/dL   Protein, ur NEGATIVE NEGATIVE mg/dL   Urobilinogen, UA 0.2 0.0 - 1.0 mg/dL   Nitrite NEGATIVE NEGATIVE   Leukocytes, UA NEGATIVE NEGATIVE    Comment:  MICROSCOPIC NOT DONE ON URINES WITH NEGATIVE PROTEIN, BLOOD, LEUKOCYTES, NITRITE, OR GLUCOSE <1000 mg/dL.  CBC WITH DIFFERENTIAL     Status: Abnormal   Collection Time: 04/15/15  5:20 AM  Result Value Ref Range   WBC 7.9 4.0 - 10.5 K/uL   RBC 5.52 4.22 - 5.81 MIL/uL   Hemoglobin 15.0 13.0 - 17.0 g/dL   HCT 41.5 39.0 - 52.0 %   MCV 75.2 (L) 78.0 - 100.0 fL   MCH 27.2 26.0 - 34.0 pg   MCHC 36.1 (H) 30.0 - 36.0 g/dL   RDW 13.7 11.5 - 15.5 %   Platelets 355 150 - 400 K/uL   Neutrophils Relative % 79 %   Neutro Abs 6.2 1.7 - 7.7 K/uL   Lymphocytes Relative 14 %   Lymphs Abs 1.1 0.7 - 4.0 K/uL   Monocytes Relative 7 %   Monocytes Absolute 0.5 0.1 - 1.0 K/uL   Eosinophils Relative 0 %   Eosinophils Absolute 0.0 0.0 - 0.7 K/uL   Basophils Relative 0 %   Basophils Absolute 0.0 0.0 - 0.1 K/uL  Comprehensive metabolic panel     Status: Abnormal   Collection Time: 04/15/15  5:20 AM  Result Value Ref Range   Sodium 141 135 - 145 mmol/L    Comment: DELTA CHECK NOTED REPEATED TO VERIFY    Potassium 4.1 3.5 - 5.1 mmol/L   Chloride 101 101 - 111 mmol/L   CO2 31 22 - 32 mmol/L   Glucose, Bld 119 (H) 65 - 99 mg/dL   BUN <5 (L) 6 - 20 mg/dL   Creatinine, Ser 0.71 0.61 - 1.24 mg/dL   Calcium 9.3 8.9 - 10.3 mg/dL   Total Protein 8.2 (H) 6.5 - 8.1 g/dL   Albumin 4.7 3.5 - 5.0 g/dL   AST 252 (H) 15 - 41 U/L   ALT 465 (H) 17 - 63 U/L   Alkaline Phosphatase 372 (H) 38 - 126 U/L   Total Bilirubin 7.2 (H) 0.3 - 1.2 mg/dL   GFR calc non Af Amer >60 >60 mL/min   GFR calc Af Amer >60 >60 mL/min    Comment: (NOTE) The eGFR has been calculated using the CKD EPI equation. This calculation has not been validated in all clinical situations. eGFR's persistently <60 mL/min signify possible Chronic Kidney Disease.    Anion gap 9 5 - 15   US  Abdomen Complete  04/14/2015   CLINICAL DATA:  Right upper quadrant pain and increased liver function tests for 3 weeks.  EXAM: ULTRASOUND ABDOMEN COMPLETE   COMPARISON:  None.  FINDINGS: Gallbladder: The gallbladder is normally distended, partially filled with multiple mobile shadowing gallstones measuring up to 6 mm. The gallbladder wall is thickened and edematous measuring up to 6 mm. Sonographic Murphy's sign is positive.  Common bile duct: Diameter: Enlarged to 8.5 mm.  Liver: No focal lesion identified. Within normal limits in parenchymal echogenicity.  IVC: No abnormality visualized.  Pancreas: Visualized portion unremarkable, however partially obscured by bowel gas.  Spleen: Size and appearance within normal limits.  Right Kidney: Length: 11.9 cm. Echogenicity within normal limits. No mass or hydronephrosis visualized.  Left Kidney: Length: 11.4 cm. Echogenicity within normal limits. No mass or hydronephrosis visualized.  Abdominal aorta: Was not seen.  Other findings: None.  IMPRESSION: Cholelithiasis with evidence of acute cholecystitis.  Dilated common bile duct of 8.5 mm, which raises the possibility of concomitant choledocholithiasis.  These results were called by telephone at the time of interpretation on 04/14/2015 at 7:46 pm to Dr. Theotis Burrow , who verbally acknowledged these results.   Electronically Signed   By: Fidela Salisbury M.D.   On: 04/14/2015 19:51   Review of Systems  Constitutional: Negative.   HENT: Negative.   Eyes: Negative.   Respiratory: Negative.   Cardiovascular: Negative.   Gastrointestinal: Positive for nausea, vomiting, abdominal pain and constipation. Negative for heartburn, diarrhea, blood in stool and melena.  Genitourinary: Negative for dysuria, urgency, frequency, hematuria and flank pain.       Dark urine  Musculoskeletal: Negative.   Skin: Negative.   Neurological: Negative.   Endo/Heme/Allergies: Negative.   Psychiatric/Behavioral: Negative.    Blood pressure 137/85, pulse 86, temperature 98.9 F (37.2 C), temperature source Oral, resp. rate 18, height _0  (1.753 m), weight 97.387 kg (214 lb 11.2 oz),  SpO2 99 %. Physical Exam  Constitutional: He is oriented to person, place, and time. He appears well-developed and well-nourished.  HENT:  Head: Normocephalic and atraumatic.  Eyes: Conjunctivae and EOM are normal. Pupils are equal, round, and reactive to light. Scleral icterus is present.  Neck: Normal range of motion. Neck supple.  Cardiovascular: Normal rate and regular rhythm.   Respiratory: Effort normal and breath sounds normal.  GI: Soft. Bowel sounds are normal. He exhibits no distension and no mass. There is hepatosplenomegaly. There is tenderness in the right upper quadrant. There is no rigidity, no rebound, no guarding, no tenderness at McBurney's point and negative Murphy's sign. No hernia.  Musculoskeletal: Normal range of motion.  Neurological: He is alert and oriented to person, place, and time.   Assessment/Plan: 1) Cholelithiasis with abnormal LFT's and a dilated CBD-?choledocholithiasis without evidence of acute cholecystitis on Rocephin. ERCP planned to be done today before the laparoscopic cholecystectomy tomorrow. 2) Hyperglycemia with a family history of diabetes-check fasting HbA1c. Aricela Bertagnolli 04/15/2015, 7:36 AM

## 2015-04-15 NOTE — Anesthesia Preprocedure Evaluation (Addendum)
Anesthesia Evaluation  Patient identified by MRN, date of birth, ID band Patient awake    Reviewed: Allergy & Precautions, NPO status , Patient's Chart, lab work & pertinent test results  Airway Mallampati: II  TM Distance: >3 FB Neck ROM: Full    Dental  (+) Teeth Intact, Dental Advisory Given   Pulmonary neg pulmonary ROS,    Pulmonary exam normal breath sounds clear to auscultation       Cardiovascular negative cardio ROS Normal cardiovascular exam Rhythm:Regular Rate:Normal     Neuro/Psych negative neurological ROS  negative psych ROS   GI/Hepatic negative GI ROS, Neg liver ROS, Cholelithiasis with abnormal LFT's and a dilated CBD   Endo/Other  negative endocrine ROSObesity   Renal/GU negative Renal ROS  negative genitourinary   Musculoskeletal negative musculoskeletal ROS (+)   Abdominal   Peds negative pediatric ROS (+)  Hematology negative hematology ROS (+)   Anesthesia Other Findings Day of surgery medications reviewed with the patient.  Reproductive/Obstetrics negative OB ROS                             Anesthesia Physical Anesthesia Plan  ASA: III  Anesthesia Plan: General   Post-op Pain Management:    Induction: Intravenous  Airway Management Planned: Oral ETT  Additional Equipment:   Intra-op Plan:   Post-operative Plan: Extubation in OR  Informed Consent:   Plan Discussed with: Surgeon  Anesthesia Plan Comments:         Anesthesia Quick Evaluation

## 2015-04-15 NOTE — Transfer of Care (Signed)
Immediate Anesthesia Transfer of Care Note  Patient: Jason Villarreal  Procedure(s) Performed: Procedure(s): ENDOSCOPIC RETROGRADE CHOLANGIOPANCREATOGRAPHY (ERCP) (N/A)  Patient Location: PACU  Anesthesia Type:General  Level of Consciousness: awake  Airway & Oxygen Therapy: Patient Spontanous Breathing and Patient connected to face mask oxygen  Post-op Assessment: Report given to RN and Post -op Vital signs reviewed and stable  Post vital signs: Reviewed and stable  Last Vitals:  Filed Vitals:   04/15/15 1253  BP: 128/81  Pulse: 84  Temp: 36.7 C  Resp: 16    Complications: No apparent anesthesia complications

## 2015-04-15 NOTE — Anesthesia Postprocedure Evaluation (Signed)
  Anesthesia Post-op Note  Patient: Jason Villarreal  Procedure(s) Performed: Procedure(s) (LRB): ENDOSCOPIC RETROGRADE CHOLANGIOPANCREATOGRAPHY (ERCP) (N/A)  Patient Location: PACU  Anesthesia Type: General  Level of Consciousness: awake and alert   Airway and Oxygen Therapy: Patient Spontanous Breathing  Post-op Pain: mild  Post-op Assessment: Post-op Vital signs reviewed, Patient's Cardiovascular Status Stable, Respiratory Function Stable, Patent Airway and No signs of Nausea or vomiting  Last Vitals:  Filed Vitals:   04/15/15 1617  BP: 127/79  Pulse: 70  Temp:   Resp: 16    Post-op Vital Signs: stable   Complications: No apparent anesthesia complications

## 2015-04-15 NOTE — Anesthesia Procedure Notes (Signed)
Procedure Name: Intubation Date/Time: 04/15/2015 1:28 PM Performed by: Leroy Libman L Patient Re-evaluated:Patient Re-evaluated prior to inductionOxygen Delivery Method: Circle system utilized Preoxygenation: Pre-oxygenation with 100% oxygen Intubation Type: IV induction Ventilation: Mask ventilation without difficulty Laryngoscope Size: Miller and 3 Grade View: Grade I Tube type: Oral Tube size: 7.5 mm Number of attempts: 1 Airway Equipment and Method: Stylet Placement Confirmation: ETT inserted through vocal cords under direct vision,  breath sounds checked- equal and bilateral and positive ETCO2 Secured at: 21 cm Tube secured with: Tape Dental Injury: Teeth and Oropharynx as per pre-operative assessment

## 2015-04-15 NOTE — Anesthesia Preprocedure Evaluation (Addendum)
Anesthesia Evaluation  Patient identified by MRN, date of birth, ID band Patient awake    Reviewed: Allergy & Precautions, NPO status , Patient's Chart, lab work & pertinent test results  Airway Mallampati: II  TM Distance: >3 FB Neck ROM: Full    Dental  (+) Teeth Intact, Dental Advisory Given   Pulmonary neg pulmonary ROS,    Pulmonary exam normal breath sounds clear to auscultation       Cardiovascular negative cardio ROS Normal cardiovascular exam Rhythm:Regular Rate:Normal     Neuro/Psych negative neurological ROS  negative psych ROS   GI/Hepatic Neg liver ROS, Cholelithiasis with abnormal LFT's and a dilated CBD   Endo/Other  Obesity   Renal/GU negative Renal ROS     Musculoskeletal negative musculoskeletal ROS (+)   Abdominal   Peds  Hematology negative hematology ROS (+)   Anesthesia Other Findings Day of surgery medications reviewed with the patient.  Reproductive/Obstetrics                          Anesthesia Physical Anesthesia Plan  ASA: II  Anesthesia Plan: General   Post-op Pain Management:    Induction: Intravenous  Airway Management Planned: Oral ETT  Additional Equipment:   Intra-op Plan:   Post-operative Plan: Extubation in OR  Informed Consent: I have reviewed the patients History and Physical, chart, labs and discussed the procedure including the risks, benefits and alternatives for the proposed anesthesia with the patient or authorized representative who has indicated his/her understanding and acceptance.   Dental advisory given  Plan Discussed with: CRNA  Anesthesia Plan Comments: (Risks/benefits of general anesthesia discussed with patient including risk of damage to teeth, lips, gum, and tongue, nausea/vomiting, allergic reactions to medications, and the possibility of heart attack, stroke and death.  All patient questions answered.  Patient wishes to  proceed.)        Anesthesia Quick Evaluation

## 2015-04-16 ENCOUNTER — Inpatient Hospital Stay (HOSPITAL_COMMUNITY): Payer: 59 | Admitting: Anesthesiology

## 2015-04-16 ENCOUNTER — Inpatient Hospital Stay (HOSPITAL_COMMUNITY): Payer: 59

## 2015-04-16 ENCOUNTER — Encounter (HOSPITAL_COMMUNITY)
Admission: EM | Disposition: A | Discharge status: Home / Self Care | Payer: Self-pay | Source: Home / Self Care | Attending: Surgery

## 2015-04-16 DIAGNOSIS — K8001 Calculus of gallbladder with acute cholecystitis with obstruction: Secondary | ICD-10-CM

## 2015-04-16 HISTORY — PX: CHOLECYSTECTOMY: SHX55

## 2015-04-16 SURGERY — LAPAROSCOPIC CHOLECYSTECTOMY WITH INTRAOPERATIVE CHOLANGIOGRAM
Anesthesia: General | Site: Abdomen

## 2015-04-16 MED ORDER — ROCURONIUM BROMIDE 100 MG/10ML IV SOLN
INTRAVENOUS | Status: DC | PRN
  Administered 2015-04-16 (×2): 10 mg via INTRAVENOUS
  Administered 2015-04-16: 40 mg via INTRAVENOUS

## 2015-04-16 MED ORDER — LACTATED RINGERS IR SOLN
Status: DC | PRN
  Administered 2015-04-16: 1

## 2015-04-16 MED ORDER — ONDANSETRON HCL 4 MG/2ML IJ SOLN
INTRAMUSCULAR | Status: AC
  Filled 2015-04-16: qty 2

## 2015-04-16 MED ORDER — LIDOCAINE HCL (CARDIAC) 20 MG/ML IV SOLN
INTRAVENOUS | Status: DC | PRN
  Administered 2015-04-16: 50 mg via INTRAVENOUS

## 2015-04-16 MED ORDER — BUPIVACAINE-EPINEPHRINE 0.25% -1:200000 IJ SOLN
INTRAMUSCULAR | Status: DC | PRN
  Administered 2015-04-16: 30 mL

## 2015-04-16 MED ORDER — NEOSTIGMINE METHYLSULFATE 10 MG/10ML IV SOLN
INTRAVENOUS | Status: AC
  Filled 2015-04-16: qty 1

## 2015-04-16 MED ORDER — SUCCINYLCHOLINE CHLORIDE 20 MG/ML IJ SOLN
INTRAMUSCULAR | Status: DC | PRN
  Administered 2015-04-16: 100 mg via INTRAVENOUS

## 2015-04-16 MED ORDER — LABETALOL HCL 5 MG/ML IV SOLN
INTRAVENOUS | Status: AC
  Filled 2015-04-16: qty 4

## 2015-04-16 MED ORDER — HYDROMORPHONE HCL 1 MG/ML IJ SOLN
INTRAMUSCULAR | Status: DC | PRN
  Administered 2015-04-16 (×4): 0.5 mg via INTRAVENOUS

## 2015-04-16 MED ORDER — HYDROMORPHONE HCL 1 MG/ML IJ SOLN
INTRAMUSCULAR | Status: AC
  Filled 2015-04-16: qty 1

## 2015-04-16 MED ORDER — MIDAZOLAM HCL 5 MG/5ML IJ SOLN
INTRAMUSCULAR | Status: DC | PRN
  Administered 2015-04-16: 2 mg via INTRAVENOUS

## 2015-04-16 MED ORDER — MIDAZOLAM HCL 2 MG/2ML IJ SOLN
INTRAMUSCULAR | Status: AC
  Filled 2015-04-16: qty 4

## 2015-04-16 MED ORDER — PROPOFOL 10 MG/ML IV BOLUS
INTRAVENOUS | Status: DC | PRN
  Administered 2015-04-16: 180 mg via INTRAVENOUS

## 2015-04-16 MED ORDER — POTASSIUM CHLORIDE IN NACL 20-0.45 MEQ/L-% IV SOLN
INTRAVENOUS | Status: DC
  Administered 2015-04-16 (×2): via INTRAVENOUS
  Administered 2015-04-17: 50 mL/h via INTRAVENOUS
  Administered 2015-04-17: 05:00:00 via INTRAVENOUS
  Filled 2015-04-16 (×5): qty 1000

## 2015-04-16 MED ORDER — BUPIVACAINE-EPINEPHRINE (PF) 0.25% -1:200000 IJ SOLN
INTRAMUSCULAR | Status: AC
  Filled 2015-04-16: qty 30

## 2015-04-16 MED ORDER — ROCURONIUM BROMIDE 100 MG/10ML IV SOLN
INTRAVENOUS | Status: AC
  Filled 2015-04-16: qty 1

## 2015-04-16 MED ORDER — 0.9 % SODIUM CHLORIDE (POUR BTL) OPTIME
TOPICAL | Status: DC | PRN
  Administered 2015-04-16: 1000 mL

## 2015-04-16 MED ORDER — DEXAMETHASONE SODIUM PHOSPHATE 10 MG/ML IJ SOLN
INTRAMUSCULAR | Status: AC
  Filled 2015-04-16: qty 1

## 2015-04-16 MED ORDER — LACTATED RINGERS IV SOLN
INTRAVENOUS | Status: DC
  Administered 2015-04-16: 1 via INTRAVENOUS

## 2015-04-16 MED ORDER — NEOSTIGMINE METHYLSULFATE 10 MG/10ML IV SOLN
INTRAVENOUS | Status: DC | PRN
  Administered 2015-04-16: 4 mg via INTRAVENOUS

## 2015-04-16 MED ORDER — GLYCOPYRROLATE 0.2 MG/ML IJ SOLN
INTRAMUSCULAR | Status: DC | PRN
  Administered 2015-04-16: .6 mg via INTRAVENOUS

## 2015-04-16 MED ORDER — HYDROMORPHONE HCL 1 MG/ML IJ SOLN
0.2500 mg | INTRAMUSCULAR | Status: DC | PRN
  Administered 2015-04-16 (×4): 0.5 mg via INTRAVENOUS

## 2015-04-16 MED ORDER — LABETALOL HCL 5 MG/ML IV SOLN
INTRAVENOUS | Status: DC | PRN
  Administered 2015-04-16 (×2): 2.5 mg via INTRAVENOUS

## 2015-04-16 MED ORDER — HYDROMORPHONE HCL 2 MG/ML IJ SOLN
INTRAMUSCULAR | Status: AC
  Filled 2015-04-16: qty 1

## 2015-04-16 MED ORDER — PROPOFOL 10 MG/ML IV BOLUS
INTRAVENOUS | Status: AC
  Filled 2015-04-16: qty 20

## 2015-04-16 MED ORDER — FENTANYL CITRATE (PF) 250 MCG/5ML IJ SOLN
INTRAMUSCULAR | Status: AC
  Filled 2015-04-16: qty 25

## 2015-04-16 MED ORDER — GLYCOPYRROLATE 0.2 MG/ML IJ SOLN
INTRAMUSCULAR | Status: AC
  Filled 2015-04-16: qty 3

## 2015-04-16 MED ORDER — LIDOCAINE HCL (CARDIAC) 20 MG/ML IV SOLN
INTRAVENOUS | Status: AC
  Filled 2015-04-16: qty 5

## 2015-04-16 MED ORDER — LACTATED RINGERS IV SOLN
INTRAVENOUS | Status: DC | PRN
  Administered 2015-04-16: 07:00:00 via INTRAVENOUS

## 2015-04-16 MED ORDER — DEXAMETHASONE SODIUM PHOSPHATE 10 MG/ML IJ SOLN
INTRAMUSCULAR | Status: DC | PRN
  Administered 2015-04-16: 10 mg via INTRAVENOUS

## 2015-04-16 SURGICAL SUPPLY — 46 items
APL SKNCLS STERI-STRIP NONHPOA (GAUZE/BANDAGES/DRESSINGS) ×1
APPLIER CLIP ROT 10 11.4 M/L (STAPLE) ×3
APR CLP MED LRG 11.4X10 (STAPLE) ×1
BAG SPEC RTRVL 10 TROC 200 (ENDOMECHANICALS) ×1
BAG SPEC RTRVL LRG 6X4 10 (ENDOMECHANICALS) ×1
BENZOIN TINCTURE PRP APPL 2/3 (GAUZE/BANDAGES/DRESSINGS) ×3 IMPLANT
CHLORAPREP W/TINT 26ML (MISCELLANEOUS) ×3 IMPLANT
CHOLANGIOGRAM CATH TAUT (CATHETERS) ×3 IMPLANT
CLIP APPLIE ROT 10 11.4 M/L (STAPLE) ×1 IMPLANT
CLOSURE WOUND 1/4X4 (GAUZE/BANDAGES/DRESSINGS) ×1
COVER MAYO STAND STRL (DRAPES) ×2 IMPLANT
COVER SURGICAL LIGHT HANDLE (MISCELLANEOUS) ×3 IMPLANT
DECANTER SPIKE VIAL GLASS SM (MISCELLANEOUS) ×3 IMPLANT
DRAIN CHANNEL RND F F (WOUND CARE) ×2 IMPLANT
DRAPE C-ARM 42X120 X-RAY (DRAPES) ×2 IMPLANT
DRAPE LAPAROSCOPIC ABDOMINAL (DRAPES) ×3 IMPLANT
ELECT REM PT RETURN 9FT ADLT (ELECTROSURGICAL) ×3
ELECTRODE REM PT RTRN 9FT ADLT (ELECTROSURGICAL) ×1 IMPLANT
ENDOLOOP SUT PDS II  0 18 (SUTURE) ×4
ENDOLOOP SUT PDS II 0 18 (SUTURE) IMPLANT
EVACUATOR SILICONE 100CC (DRAIN) ×2 IMPLANT
GLOVE SURG SIGNA 7.5 PF LTX (GLOVE) ×3 IMPLANT
GOWN STRL REUS W/TWL XL LVL3 (GOWN DISPOSABLE) ×9 IMPLANT
HEMOSTAT SURGICEL 4X8 (HEMOSTASIS) IMPLANT
IV CATH 14GX2 1/4 (CATHETERS) ×3 IMPLANT
IV SET EXTENSION CATH 6 NF (IV SETS) ×3 IMPLANT
KIT BASIN OR (CUSTOM PROCEDURE TRAY) ×3 IMPLANT
LIQUID BAND (GAUZE/BANDAGES/DRESSINGS) ×2 IMPLANT
POUCH RETRIEVAL ECOSAC 10 (ENDOMECHANICALS) IMPLANT
POUCH RETRIEVAL ECOSAC 10MM (ENDOMECHANICALS) ×2
POUCH SPECIMEN RETRIEVAL 10MM (ENDOMECHANICALS) ×2 IMPLANT
SET IRRIG TUBING LAPAROSCOPIC (IRRIGATION / IRRIGATOR) ×3 IMPLANT
SLEEVE XCEL OPT CAN 5 100 (ENDOMECHANICALS) ×3 IMPLANT
SPONGE DRAIN TRACH 4X4 STRL 2S (GAUZE/BANDAGES/DRESSINGS) ×2 IMPLANT
STOPCOCK 4 WAY LG BORE MALE ST (IV SETS) ×3 IMPLANT
STRIP CLOSURE SKIN 1/4X4 (GAUZE/BANDAGES/DRESSINGS) ×2 IMPLANT
SUT ETHILON 2 0 PS N (SUTURE) ×2 IMPLANT
SUT VIC AB 2-0 SH 27 (SUTURE) ×3
SUT VIC AB 2-0 SH 27X BRD (SUTURE) IMPLANT
SUT VIC AB 5-0 PS2 18 (SUTURE) ×3 IMPLANT
TAPE CLOTH SURG 4X10 WHT LF (GAUZE/BANDAGES/DRESSINGS) ×2 IMPLANT
TOWEL OR 17X26 10 PK STRL BLUE (TOWEL DISPOSABLE) ×3 IMPLANT
TRAY LAPAROSCOPIC (CUSTOM PROCEDURE TRAY) ×3 IMPLANT
TROCAR BLADELESS OPT 5 100 (ENDOMECHANICALS) ×3 IMPLANT
TROCAR XCEL BLUNT TIP 100MML (ENDOMECHANICALS) ×3 IMPLANT
TROCAR XCEL NON-BLD 11X100MML (ENDOMECHANICALS) ×2 IMPLANT

## 2015-04-16 NOTE — Anesthesia Procedure Notes (Addendum)
Procedure Name: Intubation Date/Time: 04/16/2015 7:42 AM Performed by: Paris Lore Pre-anesthesia Checklist: Patient identified, Emergency Drugs available, Suction available, Patient being monitored and Timeout performed Patient Re-evaluated:Patient Re-evaluated prior to inductionOxygen Delivery Method: Circle system utilized Preoxygenation: Pre-oxygenation with 100% oxygen Intubation Type: IV induction Ventilation: Mask ventilation without difficulty Laryngoscope Size: Mac and 4 Grade View: Grade I Tube type: Oral Tube size: 7.5 mm Number of attempts: 1 Airway Equipment and Method: Stylet Placement Confirmation: ETT inserted through vocal cords under direct vision,  positive ETCO2,  CO2 detector and breath sounds checked- equal and bilateral Secured at: 21 cm Tube secured with: Tape Dental Injury: Teeth and Oropharynx as per pre-operative assessment

## 2015-04-16 NOTE — Transfer of Care (Signed)
Immediate Anesthesia Transfer of Care Note  Patient: Jason Villarreal  Procedure(s) Performed: Procedure(s): LAPAROSCOPIC CHOLECYSTECTOMY  (N/A)  Patient Location: PACU  Anesthesia Type:General  Level of Consciousness:  sedated, patient cooperative and responds to stimulation  Airway & Oxygen Therapy:Patient Spontanous Breathing and Patient connected to face mask oxgen  Post-op Assessment:  Report given to PACU RN and Post -op Vital signs reviewed and stable  Post vital signs:  Reviewed and stable  Last Vitals:  Filed Vitals:   04/16/15 0612  BP: 127/71  Pulse: 70  Temp: 37 C  Resp: 16    Complications: No apparent anesthesia complications

## 2015-04-16 NOTE — Anesthesia Postprocedure Evaluation (Signed)
  Anesthesia Post-op Note  Patient: Jason Villarreal  Procedure(s) Performed: Procedure(s) (LRB): LAPAROSCOPIC CHOLECYSTECTOMY  (N/A)  Patient Location: PACU  Anesthesia Type: General  Level of Consciousness: awake and alert   Airway and Oxygen Therapy: Patient Spontanous Breathing  Post-op Pain: mild  Post-op Assessment: Post-op Vital signs reviewed, Patient's Cardiovascular Status Stable, Respiratory Function Stable, Patent Airway and No signs of Nausea or vomiting  Last Vitals:  Filed Vitals:   04/16/15 1115  BP: 138/98  Pulse: 67  Temp: 36.5 C  Resp: 14    Post-op Vital Signs: stable   Complications: No apparent anesthesia complications

## 2015-04-16 NOTE — Consult Note (Signed)
Patient appeared on cardiology round list during. Chart reviewed, do not see note indicating consult and there is no order. We are not listed on the treatment team either. Discussed with nurse, she also is unaware of a cardiology consult and patient is currently down in the OR for gallbladder surgery. If cardiology consult needed please contact the on call today.   Dominga Ferry MD

## 2015-04-16 NOTE — Op Note (Signed)
04/14/2015 - 04/16/2015  9:45 AM  PATIENT:  Jason Villarreal, 24 y.o., male, MRN: [id number]  PREOP DIAGNOSIS:  cholelithiasis  POSTOP DIAGNOSIS:   Acute and chronic cholecystitis with cholelithiasis  PROCEDURE:   Procedure(s):  LAPAROSCOPIC CHOLECYSTECTOMY  (unable to do cholangiogram)  SURGEON:   Ovidio Kin, M.D.  ASSISTANT:   B. Hoxworth  ANESTHESIA:   general  Anesthesiologist: Ronelle Nigh, MD CRNA: Paris Lore, CRNA  General  ASA: 3  EBL:  minimal  ml  BLOOD ADMINISTERED: none  DRAINS: 19 F Blake drain  LOCAL MEDICATIONS USED:   30 cc 1% xylocaine  SPECIMEN:   Gall bladder  COUNTS CORRECT:  YES  INDICATIONS FOR PROCEDURE:  Jason Villarreal is a 24 y.o. (DOB: 17-Mar-1991) AA  male whose primary care physician is [name]. and comes for cholecystectomy.   He was admitted 04/13/2015 with choledocholithiasis and cholecystitis.  Dr. Elnoria Howard did an ERCP on 10/8 and retrieved CBD stones.  He now comes for cholecystectomy.   The indications and risks of the gall bladder surgery were explained to the patient.  The risks include, but are not limited to, infection, bleeding, common bile duct injury and open surgery.  SURGERY:  The patient was taken to room #1 at [facility]  The abdomen was prepped with chloroprep.  The patient was on Rocephin prior to the beginning of the operation.   A time out was held and the surgical checklist run.   An infraumbilical incision was made into the abdominal cavity.  A 12 mm Hasson trocar was inserted into the abdominal cavity through the infraumbilical incision and secured with a 0 Vicryl suture.  Three additional trocars were inserted: a 10 mm trocar in the sub-xiphoid location, a 5 mm trocar in the right mid subcostal area, and a 5 mm trocar in the right lateral subcostal area.   The abdomen was explored and the liver, stomach, and bowel that could be seen were unremarkable.  The gall bladder was acutely and  chronically inflamed.  It looked like his disease process had been going on for more than a few weeks.   The gall bladder was identified, grasped, and rotated cephalad.  Disssection was carried down to the gall bladder/cystic duct junction and the cystic duct isolated.  The cystic duct was in dense acutely inflamed tissue.   The intra-operative cholangiogram was shot using a cut off Taut catheter placed through a 14 gauge angiocath in the RUQ.  The Taut catheter was inserted in the cut cystic duct.  I tried to shoot an intra-operative cholangiogram, but could not get a seal on catheter.  I did not get a cholangiogram.  But I thought that I was well away from the CBD and thought that it was best to stop at this point. I placed and 0 PDS endoloop around the cystic duct and oversewed the duct with a 2-0 vicryl suture.    The gall bladder was bluntly and sharpley dissected from the gall bladder bed.  The gall bladder was acutely and chronically inflamed.   After the gall bladder was removed from the liver, the gall bladder bed and Tri[place] of [place] were inspected.  There was no bleeding or bile leak.  The gall bladder was placed in a endocatch bag and delivered through the umbilicus.  The abdomen was irrigated with 2,500 cc saline.  Because of the tenuosness of the cystic duct, I placed a 19 french drain out the RUQ.  The trocars were then removed.  I infiltrated 30 cc of 1/4% Marcaine into the incisions.  The umbilical port closed with a 0 Vicryl suture and the skin closed with 5-0 Monocryl.  The skin was painted with Dermabond.  The patient's sponge and needle count were correct.  The patient was transported to the RR in good condition.  Ovidio Kin, MD, [facility] Pager: [phone] Office phone:  [phone]

## 2015-04-17 ENCOUNTER — Encounter (HOSPITAL_COMMUNITY): Payer: Self-pay | Admitting: Surgery

## 2015-04-17 MED ORDER — SIMETHICONE 80 MG PO CHEW
160.0000 mg | CHEWABLE_TABLET | Freq: Four times a day (QID) | ORAL | Status: DC | PRN
  Filled 2015-04-17: qty 2

## 2015-04-17 NOTE — Progress Notes (Signed)
Patient ID: Jason Villarreal, male   DOB: 11-Oct-1990, 24 y.o.   MRN: [id number] 1 Day Post-Op  Subjective: Feels puny.  Not passing much flatus.  Walking some.  IS 1250.  Not eating much, but no nausea.    Objective: Vital signs in last 24 hours: Temp:  [98.3 F (36.8 C)-100.8 F (38.2 C)] 98.7 F (37.1 C) (10/10 1001) Pulse Rate:  [66-130] 93 (10/10 1001) Resp:  [16-18] 18 (10/10 1001) BP: (122-138)/(65-90) 128/82 mmHg (10/10 1001) SpO2:  [95 %-100 %] 98 % (10/10 1001) Last BM Date: 04/15/15  Intake/Output from previous day: 10/09 0701 - 10/10 0700 In: 2822 [P.O.:1200; I.V.:1572; IV Piggyback:50] Out: 6015 [Urine:3875; Drains:90; Blood:50] Intake/Output this shift: Total I/O In: 240 [P.O.:240] Out: 450 [Urine:450]  PE: Abd: soft, appropriately tender, JP with serosang output, incisions c/d/i HEart: tachy Lungs: CTAB  Lab Results:   Recent Labs  04/14/15 1649 04/15/15 0520  WBC 10.2 7.9  HGB 15.0 15.0  HCT 40.9 41.5  PLT 349 355   BMET  Recent Labs  04/14/15 1649 04/15/15 0520  NA 134* 141  K 3.6 4.1  CL 95* 101  CO2 27 31  GLUCOSE 180* 119*  BUN 5* <5*  CREATININE 0.81 0.71  CALCIUM 9.6 9.3   PT/INR No results for input(s): LABPROT, INR in the last 72 hours. CMP     Component Value Date/Time   NA 141 04/15/2015 0520   K 4.1 04/15/2015 0520   CL 101 04/15/2015 0520   CO2 31 04/15/2015 0520   GLUCOSE 119* 04/15/2015 0520   BUN <5* 04/15/2015 0520   CREATININE 0.71 04/15/2015 0520   CREATININE 1.09 04/13/2015 0830   CALCIUM 9.3 04/15/2015 0520   PROT 8.2* 04/15/2015 0520   ALBUMIN 4.7 04/15/2015 0520   AST 252* 04/15/2015 0520   ALT 465* 04/15/2015 0520   ALKPHOS 372* 04/15/2015 0520   BILITOT 7.2* 04/15/2015 0520   GFRNONAA >60 04/15/2015 0520   GFRAA >60 04/15/2015 0520   Lipase     Component Value Date/Time   LIPASE 25 04/14/2015 1649       Studies/Results: Dg Ercp Biliary & Pancreatic Ducts  04/15/2015   CLINICAL DATA:  ERCP  biliary and pancreatic ducts. Common duct stones.  EXAM: ERCP  TECHNIQUE: Multiple spot images obtained with the fluoroscopic device and submitted for interpretation post-procedure.  FLUOROSCOPY TIME:  1 minutes 41 seconds  COMPARISON:  Ultrasound 04/14/2015  FINDINGS: 3 fluoroscopic spot images document endoscopic catheterization and opacification of the CBD. Filling defects in the distal CBD on the initial image. The intrahepatic bile ducts are incompletely opacified, mildly distended centrally. Final image shows a retraction of the scope with no definite residual CBD filling defects.  IMPRESSION: 1. Choledocholithiasis with endoscopic intervention These images were submitted for radiologic interpretation only. Please see the procedural report for the amount of contrast and the fluoroscopy time utilized.   Electronically Signed   By: Corlis Leak M.D.   On: 04/15/2015 14:18    Anti-infectives: Anti-infectives    Start     Dose/Rate Route Frequency Ordered Stop   04/15/15 0400  cefTRIAXone (ROCEPHIN) 2 g in dextrose 5 % 50 mL IVPB     2 g 100 mL/hr over 30 Minutes Intravenous Every 24 hours 04/15/15 0330         Assessment/Plan  POD 1, s/p lap chole, no IOC, POD 2, s/p ERCP -patient's TB was stable yesterday at 7.1.  Will recheck CMET tomorrow to make sure this  is still trending down -cont with diet as able -mobilize -cont IV abx therapy while here, will need orals at discharge. -decrease IVFs today and see if he can maintain hydration on his own.  Follow HR, suspect due to infection and still a component of dehydration. -anticipate hopefully DC tomorrow if improved  LOS: 3 days    Ashaya Raftery E 04/17/2015, 12:35 PM Pager: [phone]

## 2015-04-17 NOTE — Progress Notes (Signed)
Pt bp 130/90, pulse 130.  Temp 100.6.  Will obtain EKG.

## 2015-04-17 NOTE — Progress Notes (Signed)
Instructed pt on how to empty and care for his JP drain. I answered his questions pertaining to drain care.  Later on, he emptied drain himself in front of NT. Will continue to monitor.

## 2015-04-17 NOTE — Progress Notes (Signed)
Pt's EKG showed sinus tachycardia, rate of 123.  Pt asymptomatic, states he is not in any pain.  Will make oncoming MD aware as he rounds unless pt condition worsens.  Will continue to monitor.

## 2015-04-17 NOTE — Discharge Instructions (Signed)
[facility] ______[facility] °LAPAROSCOPIC SURGERY: POST OP INSTRUCTIONS °Always review your discharge instruction sheet given to you by the facility where your surgery was performed. °IF YOU HAVE DISABILITY OR FAMILY LEAVE FORMS, YOU MUST BRING THEM TO THE OFFICE FOR PROCESSING.   °DO NOT GIVE THEM TO YOUR DOCTOR. ° °1. A prescription for pain medication may be given to you upon discharge.  Take your pain medication as prescribed, if needed.  If narcotic pain medicine is not needed, then you may take acetaminophen (Tylenol) or ibuprofen (Advil) as needed. °2. Take your usually prescribed medications unless otherwise directed. °3. If you need a refill on your pain medication, please contact your pharmacy.  They will contact our office to request authorization. Prescriptions will not be filled after 5pm or on week-ends. °4. You should follow a light diet the first few days after arrival home, such as soup and crackers, etc.  Be sure to include lots of fluids daily. °5. Most patients will experience some swelling and bruising in the area of the incisions.  Ice packs will help.  Swelling and bruising can take several days to resolve.  °6. It is common to experience some constipation if taking pain medication after surgery.  Increasing fluid intake and taking a stool softener (such as Colace) will usually help or prevent this problem from occurring.  A mild laxative (Milk of Magnesia or Miralax) should be taken according to package instructions if there are no bowel movements after 48 hours. °7. Unless discharge instructions indicate otherwise, you may remove your bandages 24-48 hours after surgery, and you may shower at that time.  You may have steri-strips (small skin tapes) in place directly over the incision.  These strips should be left on the skin for 7-10 days.  If your surgeon used skin glue on the incision, you may shower in 24 hours.  The glue will flake off over the next 2-3 weeks.  Any sutures or  staples will be removed at the office during your follow-up visit. °8. ACTIVITIES:  You may resume regular (light) daily activities beginning the next day--such as daily self-care, walking, climbing stairs--gradually increasing activities as tolerated.  You may have sexual intercourse when it is comfortable.  Refrain from any heavy lifting or straining until approved by your doctor. °a. You may drive when you are no longer taking prescription pain medication, you can comfortably wear a seatbelt, and you can safely maneuver your car and apply brakes. °b. RETURN TO WORK:  __________________________________________________________ °9. You should see your doctor in the office for a follow-up appointment approximately 2-3 weeks after your surgery.  Make sure that you call for this appointment within a day or two after you arrive home to insure a convenient appointment time. °10. OTHER INSTRUCTIONS: __________________________________________________________________________________________________________________________ __________________________________________________________________________________________________________________________ °WHEN TO CALL YOUR DOCTOR: °1. Fever over 101.0 °2. Inability to urinate °3. Continued bleeding from incision. °4. Increased pain, redness, or drainage from the incision. °5. Increasing abdominal pain ° °The clinic staff is available to answer your questions during regular business hours.  Please don’t hesitate to call and ask to speak to one of the nurses for clinical concerns.  If you have a medical emergency, go to the nearest emergency room or call 911.  A surgeon from [facility] is always on call at the hospital. °[address], [address], [place], [place]  [address] ? [address] [address], [place], [place]   [address] °[phone] ? [phone] ? FAX [phone] °Web site:   [email or web address] ° °Bulb Drain Home Care °A bulb drain consists of a thin  rubber tube and a soft, round bulb that creates a gentle suction. The rubber tube is placed in the area where you had surgery. A bulb is attached to the end of the tube that is outside the body. The bulb drain removes excess fluid that normally builds up in a surgical wound after surgery. The color and amount of fluid will vary. Immediately after surgery, the fluid is bright red and is a little thicker than water. It may gradually change to a yellow or pink color and become more thin and water-like. When the amount decreases to about 1 or 2 tbsp in 24 hours, your health care provider will usually remove it. °DAILY CARE °· Keep the bulb flat (compressed) at all times, except while emptying it. The flatness creates suction. You can flatten the bulb by squeezing it firmly in the middle and then closing the cap. °· Keep sites where the tube enters the skin dry and covered with a bandage (dressing). °· Secure the tube 1-2 in (2.5-5.1 cm) below the insertion sites to keep it from pulling on your stitches. The tube is stitched in place and will not slip out. °· Secure the bulb as directed by your health care provider. °· For the first 3 days after surgery, there usually is more fluid in the bulb. Empty the bulb whenever it becomes half full because the bulb does not create enough suction if it is too full. The bulb could also overflow. Write down how much fluid you remove each time you empty your drain. Add up the amount removed in 24 hours. °· Empty the bulb at the same time every day once the amount of fluid decreases and you only need to empty it once a day. Write down the amounts and the 24-hour totals to give to your health care provider. This helps your health care provider know when the tubes can be removed. °EMPTYING THE BULB DRAIN °Before emptying the bulb, get a measuring cup, a piece of paper and a pen, and wash your hands. °· Gently run your fingers down the tube (stripping) to empty any drainage from the  tubing into the bulb. This may need to be done several times a day to clear the tubing of clots and tissue. °· Open the bulb cap to release suction, which causes it to inflate. Do not touch the inside of the cap. °· Gently run your fingers down the tube (stripping) to empty any drainage from the tubing into the bulb. °· Hold the cap out of the way, and pour fluid into the measuring cup.   °· Squeeze the bulb to provide suction.  °· Replace the cap.   °· Check the tape that holds the tube to your skin. If it is becoming loose, you can remove the loose piece of tape and apply a new one. Then, pin the bulb to your shirt.   °· Write down the amount of fluid you emptied out. Write down the date and each time you emptied your bulb drain. (If there are 2 bulbs, note the amount of drainage from each bulb and keep the totals separate. Your health care provider will want to know the total amounts for each drain and which tube is draining more.)   °· Flush the fluid down the toilet and wash your hands.   °· Call your health care provider once you have less than 2 tbsp of fluid collecting in the bulb drain every   24 hours. °If there is drainage around the tube site, change dressings and keep the area dry. Cleanse around tube with sterile saline and place dry gauze around site. This gauze should be changed when it is soiled. If it stays clean and unsoiled, it should still be changed daily.  °SEEK MEDICAL CARE IF: °· Your drainage has a bad smell or is cloudy.   °· You have a fever.   °· Your drainage is increasing instead of decreasing.   °· Your tube fell out.   °· You have redness or swelling around the tube site.   °· You have drainage from a surgical wound.   °· Your bulb drain will not stay flat after you empty it.   °MAKE SURE YOU:  °· Understand these instructions. °· Will watch your condition. °· Will get help right away if you are not doing well or get worse. °  °This information is not intended to replace advice given  to you by your health care provider. Make sure you discuss any questions you have with your health care provider. °  °Document Released: 06/21/2000 Document Revised: 07/15/2014 Document Reviewed: 01/11/2015 °Elsevier Interactive Patient Education ©2016 [employer] ° °

## 2015-04-18 LAB — COMPREHENSIVE METABOLIC PANEL
ALBUMIN: 3.6 g/dL (ref 3.5–5.0)
ALK PHOS: 249 U/L — AB (ref 38–126)
ALT: 175 U/L — AB (ref 17–63)
ANION GAP: 8 (ref 5–15)
AST: 46 U/L — AB (ref 15–41)
BUN: 5 mg/dL — ABNORMAL LOW (ref 6–20)
CALCIUM: 8.7 mg/dL — AB (ref 8.9–10.3)
CO2: 28 mmol/L (ref 22–32)
Chloride: 101 mmol/L (ref 101–111)
Creatinine, Ser: 0.87 mg/dL (ref 0.61–1.24)
GFR calc Af Amer: >60 mL/min (ref >60)
GFR calc non Af Amer: >60 mL/min (ref >60)
GLUCOSE: 119 mg/dL — AB (ref 65–99)
Potassium: 3.7 mmol/L (ref 3.5–5.1)
SODIUM: 137 mmol/L (ref 135–145)
Total Bilirubin: 1.5 mg/dL — ABNORMAL HIGH (ref 0.3–1.2)
Total Protein: 6.9 g/dL (ref 6.5–8.1)

## 2015-04-18 MED ORDER — CIPROFLOXACIN HCL 500 MG PO TABS: 500.0000 mg | ORAL_TABLET | Freq: Two times a day (BID) | ORAL | Status: DC

## 2015-04-18 MED ORDER — HYDROCODONE-ACETAMINOPHEN 5-325 MG PO TABS: 1.0000 | ORAL_TABLET | ORAL | Status: DC | PRN

## 2015-04-18 NOTE — Discharge Summary (Signed)
Patient ID: ERYX ZANE MRN: [id number] DOB/AGE: 1990-08-07 24 y.o.  Admit date: 04/14/2015 Discharge date: 04/18/2015  Procedures: lap chole with placement of JP drain ERCP  Consults: GI  Reason for Admission:  Jason Villarreal is a 24 y.o. (DOB: 12-May-1991) AA male whose primary care physician is [name]. and comes to the [facility] ER today for abdominal pain. His girlfriend, Jason Villarreal, is with him.  He has had a 3 week history of right upper quadrant abdominal pain. He has also had nausea and vomiting. He has not had much of an appetite. He noticed about 3 or 4 days ago, his urine turned dark. He has no prior history of stomach, liver, pancreas, or colon disease. He has had no prior abdominal surgery. He thinks that a cousin had gall bladder disease, but no other family members. He went to [facility] yesterday. They thought he had constipation, but when his labs came back with elevated LFT's, he was instructed to go to the [facility].  Abdominal US - 04/14/2015 - Cholelithiasis with evidence of acute cholecystitis. Dilated common bile duct of 8.5 mm, which raises the possibility of concomitant choledocholithiasis.  Admission Diagnoses:  1. Choledocholithiasis 2. Cholecystitis   Hospital Course: The patient was admitted and started on rocephin.  GI was called and he was taken the following day for an ERCP with stone extraction.  The following day he was taken to the OR where he underwent a lap chole with placement of a JP drain.  He was doing ok on POD 1, but was still quite tachycardic, not mobilizing enough, still feeling overall puny.  He was kept another day for monitoring.  On POD 2, he was feeling much better.  His labs had improved and TB has essentially normalized to 1.5 down from 7.  He was less tachycardic, eating a little more, and his pain was well controlled.  He was felt stable for dc home at this  time with his JP drain in place and 5 additional days of an oral abx.  PE: Abd: soft, appropriately tender, JP drain with serosang output, +BS, incisions c/d/i  Discharge Diagnoses:  Active Problems:   Calculous cholecystitis with obstruction choledocholithiasis S/p ERCP S/p lap chole with JP drain placement  Discharge Medications:   Medication List    TAKE these medications        ciprofloxacin 500 MG tablet  Commonly known as:  CIPRO  Take 1 tablet (500 mg total) by mouth 2 (two) times daily.     dicyclomine 10 MG capsule  Commonly known as:  BENTYL  Take 1 pill before meals and at bedtime only if needed for abdominal pain.     HYDROcodone-acetaminophen 5-325 MG tablet  Commonly known as:  NORCO/VICODIN  Take 1-2 tablets by mouth every 4 (four) hours as needed for moderate pain.     ondansetron 4 MG disintegrating tablet  Commonly known as:  ZOFRAN ODT  Take 1 tablet (4 mg total) by mouth every 8 (eight) hours as needed for nausea or vomiting.        Discharge Instructions:     Follow-up Information    Follow up with [facility], MD On 04/21/2015.   Specialty:  General Surgery   Why:  1:00pm, arrive no later than 12:30pm for paperwork   Contact information:   [address] [place] [address] [phone]       Signed: Letha Cape 04/18/2015, 9:56 AM

## 2015-04-18 NOTE — Progress Notes (Signed)
Assessment unchanged. Pt verbalized understanding of dc instructions through teach back including follow up care and when to call MD. Pt independent with drain care. Drain care information provided along with drain record form. Scripts x 2 given as provided by MD. Discharged via wc to front entrance to meet awaiting vehicle to carry home. Accompanied by mom and NT.

## 2016-03-26 ENCOUNTER — Encounter (HOSPITAL_COMMUNITY): Payer: Self-pay | Admitting: Emergency Medicine

## 2016-03-26 ENCOUNTER — Emergency Department (HOSPITAL_COMMUNITY)
Admission: EM | Admit: 2016-03-26 | Discharge: 2016-03-27 | Disposition: A | Discharge status: Home / Self Care | Payer: 59 | Attending: Emergency Medicine | Admitting: Emergency Medicine

## 2016-03-26 DIAGNOSIS — Z79899 Other long term (current) drug therapy: Secondary | ICD-10-CM | POA: Diagnosis not present

## 2016-03-26 DIAGNOSIS — R740 Nonspecific elevation of levels of transaminase and lactic acid dehydrogenase [LDH]: Secondary | ICD-10-CM | POA: Diagnosis not present

## 2016-03-26 DIAGNOSIS — R197 Diarrhea, unspecified: Secondary | ICD-10-CM

## 2016-03-26 DIAGNOSIS — R509 Fever, unspecified: Secondary | ICD-10-CM | POA: Diagnosis present

## 2016-03-26 DIAGNOSIS — R112 Nausea with vomiting, unspecified: Secondary | ICD-10-CM | POA: Insufficient documentation

## 2016-03-26 DIAGNOSIS — R7401 Elevation of levels of liver transaminase levels: Secondary | ICD-10-CM

## 2016-03-26 NOTE — ED Provider Notes (Addendum)
[facility]-EMERGENCY DEPT Provider Note   CSN: [id number] Arrival date & time: 03/26/16  2247  By signing my name below, I, Linna DarnerRussell Lagoy, attest that this documentation has been prepared under the direction and in the presence of physician practitioner, Aza Dantes, MD. Electronically Signed: Linna Darnerussell Donze, Scribe. 03/27/2016. 12:48 AM.  History   Chief Complaint Chief Complaint  Patient presents with  . Fever  . Emesis    The history is provided by the patient. No language interpreter was used.  Fever   This is a new problem. The current episode started more than 2 days ago. The problem occurs constantly. The problem has been resolved. The maximum temperature noted was 102 to 102.9 F. Associated symptoms include diarrhea and vomiting. Pertinent negatives include no chest pain and no fussiness. He has tried acetaminophen for the symptoms. The treatment provided no relief.  Emesis   This is a new problem. The current episode started more than 2 days ago. The problem has not changed since onset.The emesis has an appearance of stomach contents. The maximum temperature recorded prior to his arrival was 102 to 102.9 F. Associated symptoms include diarrhea and a fever (resolved). Pertinent negatives include no abdominal pain.     HPI Comments: Jason Villarreal is a 25 y.o. male who presents to the Emergency Department complaining of sudden onset, constant, vomiting for the last three days. He reports associated diarrhea and decreased appetite. Pt reports he has been vomiting after eating since onset; he has mostly been eating soup and vomited after eating sushi yesterday. Pt reports 2 episodes of diarrhea daily since onset. He states his diarrhea is completely liquid. Pt notes he felt febrile at home PTA and measured a temperature of 102.8; he states his fever resolved on its own in the waiting room tonight. Pt took Tylenol x2 yesterday. He notes h/o cholecystectomy last year and reports he has had  some digestion problems with eating greasy foods since then. Pt denies known sick contacts, using new medications, alcohol use, recent swimming, recent amping, or recent foreign travel. Pt further denies numbness, weakness, or any other associated symptoms.  History reviewed. No pertinent past medical history.  Patient Active Problem List   Diagnosis Date Noted  . Calculous cholecystitis with obstruction 04/14/2015    Past Surgical History:  Procedure Laterality Date  . CHOLECYSTECTOMY N/A 04/16/2015   Procedure: LAPAROSCOPIC CHOLECYSTECTOMY ;  Surgeon: Ovidio Kinavid Newman, MD;  Location: [facility];  Service: General;  Laterality: N/A;       Home Medications    Prior to Admission medications   Medication Sig Start Date End Date Taking? Authorizing Provider  acetaminophen (TYLENOL) 500 MG tablet Take 500 mg by mouth every 6 (six) hours as needed for mild pain.   Yes Historical Provider, MD  ciprofloxacin (CIPRO) 500 MG tablet Take 1 tablet (500 mg total) by mouth 2 (two) times daily. Patient not taking: Reported on 03/27/2016 04/18/15   Barnetta ChapelKelly Osborne, PA-C  dicyclomine (BENTYL) 10 MG capsule Take 1 pill before meals and at bedtime only if needed for abdominal pain. Patient not taking: Reported on 03/27/2016 04/13/15   Peyton Najjaravid H Hopper, MD  HYDROcodone-acetaminophen (NORCO/VICODIN) 5-325 MG tablet Take 1-2 tablets by mouth every 4 (four) hours as needed for moderate pain. Patient not taking: Reported on 03/27/2016 04/18/15   Barnetta ChapelKelly Osborne, PA-C  ondansetron (ZOFRAN ODT) 4 MG disintegrating tablet Take 1 tablet (4 mg total) by mouth every 8 (eight) hours as needed for nausea or vomiting. Patient not  taking: Reported on 03/27/2016 04/13/15   Peyton Najjar, MD    Family History Family History  Problem Relation Age of Onset  . Diabetes Father     Social History Social History  Substance Use Topics  . Smoking status: Never Smoker  . Smokeless tobacco: Never Used  . Alcohol use No      Allergies   Penicillins   Review of Systems Review of Systems  Constitutional: Positive for fever (resolved).  Respiratory: Negative for shortness of breath.   Cardiovascular: Negative for chest pain.  Gastrointestinal: Positive for diarrhea and vomiting. Negative for abdominal pain, anal bleeding, blood in stool and constipation.  Genitourinary: Negative for dysuria.  Skin: Negative for color change.  Neurological: Negative for weakness and numbness.  All other systems reviewed and are negative.   Physical Exam Updated Vital Signs BP 123/90   Pulse 80   Temp 98.4 F (36.9 C)   Resp 16   SpO2 98%   Physical Exam  Constitutional: He appears well-developed and well-nourished.  HENT:  Head: Normocephalic.  Mouth/Throat: Oropharynx is clear and moist. No oropharyngeal exudate.  Eyes: Conjunctivae and EOM are normal. Pupils are equal, round, and reactive to light. Right eye exhibits no discharge. Left eye exhibits no discharge. No scleral icterus.  Neck: Normal range of motion. Neck supple. No JVD present. No tracheal deviation present.  Trachea is midline. No stridor or carotid bruits.  Cardiovascular: Normal rate, regular rhythm, normal heart sounds and intact distal pulses.   No murmur heard. Pulmonary/Chest: Effort normal and breath sounds normal. No stridor. No respiratory distress. He has no wheezes. He has no rales.  Lungs CTA bilaterally.  Abdominal: Soft. Bowel sounds are normal. He exhibits no distension and no mass. There is no tenderness. There is no rebound and no guarding.  Profuse gas throughout abdomen.  Musculoskeletal: Normal range of motion. He exhibits no edema or tenderness.  Lymphadenopathy:    He has no cervical adenopathy.  Neurological: He is alert. He has normal reflexes.  Skin: Skin is warm and dry. Capillary refill takes less than 2 seconds.  Psychiatric: He has a normal mood and affect. His behavior is normal.  Nursing note and vitals  reviewed.   ED Treatments / Results   Vitals:   03/26/16 2332 03/27/16 0207  BP: 123/90 120/73  Pulse: 80 78  Resp: 16 18  Temp: 98.4 F (36.9 C) 99.6 F (37.6 C)   Results for orders placed or performed during the hospital encounter of 03/26/16  Lipase, blood  Result Value Ref Range   Lipase 23 11 - 51 U/L  Comprehensive metabolic panel  Result Value Ref Range   Sodium 132 (L) 135 - 145 mmol/L   Potassium 3.6 3.5 - 5.1 mmol/L   Chloride 94 (L) 101 - 111 mmol/L   CO2 27 22 - 32 mmol/L   Glucose, Bld 113 (H) 65 - 99 mg/dL   BUN 10 6 - 20 mg/dL   Creatinine, Ser 1.61 0.61 - 1.24 mg/dL   Calcium 9.3 8.9 - 09.6 mg/dL   Total Protein 9.3 (H) 6.5 - 8.1 g/dL   Albumin 4.8 3.5 - 5.0 g/dL   AST 045 (H) 15 - 41 U/L   ALT 214 (H) 17 - 63 U/L   Alkaline Phosphatase 194 (H) 38 - 126 U/L   Total Bilirubin 1.9 (H) 0.3 - 1.2 mg/dL   GFR calc non Af Amer >60 >60 mL/min   GFR calc Af Amer >60 >60  mL/min   Anion gap 11 5 - 15  CBC  Result Value Ref Range   WBC 8.8 4.0 - 10.5 K/uL   RBC 5.44 4.22 - 5.81 MIL/uL   Hemoglobin 14.6 13.0 - 17.0 g/dL   HCT 16.1 09.6 - 04.5 %   MCV 74.3 (L) 78.0 - 100.0 fL   MCH 26.8 26.0 - 34.0 pg   MCHC 36.1 (H) 30.0 - 36.0 g/dL   RDW 40.9 81.1 - 91.4 %   Platelets 272 150 - 400 K/uL  Urinalysis, Routine w reflex microscopic  Result Value Ref Range   Color, Urine YELLOW YELLOW   APPearance CLEAR CLEAR   Specific Gravity, Urine 1.009 1.005 - 1.030   pH 6.0 5.0 - 8.0   Glucose, UA NEGATIVE NEGATIVE mg/dL   Hgb urine dipstick SMALL (A) NEGATIVE   Bilirubin Urine NEGATIVE NEGATIVE   Ketones, ur NEGATIVE NEGATIVE mg/dL   Protein, ur NEGATIVE NEGATIVE mg/dL   Nitrite NEGATIVE NEGATIVE   Leukocytes, UA NEGATIVE NEGATIVE  Urine microscopic-add on  Result Value Ref Range   Squamous Epithelial / LPF NONE SEEN NONE SEEN   WBC, UA NONE SEEN 0 - 5 WBC/hpf   RBC / HPF NONE SEEN 0 - 5 RBC/hpf   Bacteria, UA NONE SEEN NONE SEEN   Ct Abdomen Pelvis W  Contrast  Result Date: 03/27/2016 CLINICAL DATA:  Acute onset of nausea, vomiting and diarrhea. Initial encounter. EXAM: CT ABDOMEN AND PELVIS WITH CONTRAST TECHNIQUE: Multidetector CT imaging of the abdomen and pelvis was performed using the standard protocol following bolus administration of intravenous contrast. CONTRAST:  ISOVUE-300 IOPAMIDOL (ISOVUE-300) INJECTION 61% COMPARISON:  Abdominal ultrasound performed 04/14/2015 FINDINGS: Lower chest: The visualized lung bases are grossly clear. The visualized portions of the mediastinum are unremarkable. Hepatobiliary: The liver is unremarkable in appearance. The patient is status post cholecystectomy, with clips noted at the gallbladder fossa. The common bile duct remains normal in caliber. Pancreas: The pancreas is within normal limits. Spleen: The spleen is unremarkable in appearance. Adrenals/Urinary Tract: The adrenal glands are unremarkable in appearance. The kidneys are within normal limits. There is no evidence of hydronephrosis. No renal or ureteral stones are identified. No perinephric stranding is seen. Stomach/Bowel: The stomach is unremarkable in appearance. The small bowel is within normal limits. The appendix is normal in caliber, without evidence of appendicitis. The colon is unremarkable in appearance. Vascular/Lymphatic: The abdominal aorta is unremarkable in appearance. The inferior vena cava is grossly unremarkable. No retroperitoneal lymphadenopathy is seen. No pelvic sidewall lymphadenopathy is identified. Reproductive: The bladder is moderately distended and grossly unremarkable. The prostate remains normal in size. Other: No additional soft tissue abnormalities are seen. Musculoskeletal: No acute osseous abnormalities are identified. The visualized musculature is unremarkable in appearance. IMPRESSION: No acute abnormality seen within the abdomen or pelvis. Electronically Signed   By: Roanna Raider M.D.   On: 03/27/2016 02:22    Medications  sodium chloride 0.9 % bolus 1,000 mL (0 mLs Intravenous Stopped 03/27/16 0243)  ondansetron (ZOFRAN) injection 4 mg (4 mg Intravenous Given 03/27/16 0123)  iopamidol (ISOVUE-300) 61 % injection 100 mL (100 mLs Intravenous Contrast Given 03/27/16 0209)     Radiology No results found.  Procedures Procedures (including critical care time)  DIAGNOSTIC STUDIES: Oxygen Saturation is 98% on RA, normal by my interpretation.    COORDINATION OF CARE: 12:48 AM Discussed treatment plan with pt at bedside and pt agreed to plan.  Medications Ordered in ED Medications - No data  to display   Initial Impression / Assessment and Plan / ED Course  I have reviewed the triage vital signs and the nursing notes.  Pertinent labs & imaging results that were available during my care of the patient were reviewed by me and considered in my medical decision making (see chart for details).  Clinical Course   PO challenged successfully in the ED.  No acute findings on CT.  Well appearing and very comfortable.  But persistent elevation of LFTs will need to be seen by GI.  I suspect this is a viral illness.  But strict fever and abdominal pain precautions given.  All questions answered to patient's satisfaction. Based on history and exam patient has been appropriately medically screened and emergency conditions excluded. Patient is stable for discharge at this time. Follow up with your PMD for recheck in 2 days and strict return precautions given  Reccommend a bland diet and taking zofran before any meals to push fluids and food.  Will add omeprazole as this could also be H Pylori.    I personally performed the services described in this documentation, which was scribed in my presence. The recorded information has been reviewed and is accurate.     Final Clinical Impressions(s) / ED Diagnoses   Final diagnoses:  None    New Prescriptions New Prescriptions   No medications on file      Wendelyn Kiesling, MD 03/27/16 0336    Fergie Sherbert, MD 03/27/16 [phone]

## 2016-03-26 NOTE — ED Triage Notes (Signed)
Pt from home with complaints of fever, headache, and emesis x 1 week. Pt is not febrile or tachycardic at time of assessment. Pt states he has had about 2 episodes of emesis today. Pt denies urinary symptoms, but states he has had diarrhea twice today

## 2016-03-27 ENCOUNTER — Encounter (HOSPITAL_COMMUNITY): Payer: Self-pay

## 2016-03-27 ENCOUNTER — Emergency Department (HOSPITAL_COMMUNITY): Payer: 59

## 2016-03-27 LAB — COMPREHENSIVE METABOLIC PANEL
ALT: 214 U/L — ABNORMAL HIGH (ref 17–63)
ANION GAP: 11 (ref 5–15)
AST: 189 U/L — ABNORMAL HIGH (ref 15–41)
Albumin: 4.8 g/dL (ref 3.5–5.0)
Alkaline Phosphatase: 194 U/L — ABNORMAL HIGH (ref 38–126)
BILIRUBIN TOTAL: 1.9 mg/dL — AB (ref 0.3–1.2)
BUN: 10 mg/dL (ref 6–20)
CO2: 27 mmol/L (ref 22–32)
Calcium: 9.3 mg/dL (ref 8.9–10.3)
Chloride: 94 mmol/L — ABNORMAL LOW (ref 101–111)
Creatinine, Ser: 1.2 mg/dL (ref 0.61–1.24)
GFR calc Af Amer: >60 mL/min (ref >60)
Glucose, Bld: 113 mg/dL — ABNORMAL HIGH (ref 65–99)
POTASSIUM: 3.6 mmol/L (ref 3.5–5.1)
Sodium: 132 mmol/L — ABNORMAL LOW (ref 135–145)
TOTAL PROTEIN: 9.3 g/dL — AB (ref 6.5–8.1)

## 2016-03-27 LAB — CBC
HEMATOCRIT: 40.4 % (ref 39.0–52.0)
HEMOGLOBIN: 14.6 g/dL (ref 13.0–17.0)
MCH: 26.8 pg (ref 26.0–34.0)
MCHC: 36.1 g/dL — ABNORMAL HIGH (ref 30.0–36.0)
MCV: 74.3 fL — ABNORMAL LOW (ref 78.0–100.0)
Platelets: 272 10*3/uL (ref 150–400)
RBC: 5.44 MIL/uL (ref 4.22–5.81)
RDW: 14 % (ref 11.5–15.5)
WBC: 8.8 10*3/uL (ref 4.0–10.5)

## 2016-03-27 LAB — URINALYSIS, ROUTINE W REFLEX MICROSCOPIC
Bilirubin Urine: NEGATIVE
Glucose, UA: NEGATIVE mg/dL
Ketones, ur: NEGATIVE mg/dL
LEUKOCYTES UA: NEGATIVE
NITRITE: NEGATIVE
PH: 6 (ref 5.0–8.0)
Protein, ur: NEGATIVE mg/dL
SPECIFIC GRAVITY, URINE: 1.009 (ref 1.005–1.030)

## 2016-03-27 LAB — URINE MICROSCOPIC-ADD ON
Bacteria, UA: NONE SEEN
RBC / HPF: NONE SEEN RBC/hpf (ref 0–5)
Squamous Epithelial / HPF: NONE SEEN
WBC UA: NONE SEEN WBC/hpf (ref 0–5)

## 2016-03-27 LAB — LIPASE, BLOOD: Lipase: 23 U/L (ref 11–51)

## 2016-03-27 MED ORDER — OMEPRAZOLE 20 MG PO CPDR: 20.0000 mg | DELAYED_RELEASE_CAPSULE | Freq: Every day | ORAL | 0 refills | Status: DC

## 2016-03-27 MED ORDER — ONDANSETRON 8 MG PO TBDP: ORAL_TABLET | ORAL | 0 refills | Status: DC

## 2016-03-27 MED ORDER — IOPAMIDOL (ISOVUE-300) INJECTION 61%
100.0000 mL | Freq: Once | INTRAVENOUS | Status: AC | PRN
  Administered 2016-03-27: 100 mL via INTRAVENOUS

## 2016-03-27 MED ORDER — ONDANSETRON HCL 4 MG/2ML IJ SOLN
4.0000 mg | Freq: Once | INTRAMUSCULAR | Status: AC
  Administered 2016-03-27: 4 mg via INTRAVENOUS
  Filled 2016-03-27: qty 2

## 2016-03-27 MED ORDER — SODIUM CHLORIDE 0.9 % IV BOLUS (SEPSIS)
1000.0000 mL | Freq: Once | INTRAVENOUS | Status: AC
Start: 2016-03-27 — End: 2016-03-27
  Administered 2016-03-27: 1000 mL via INTRAVENOUS

## 2016-03-27 NOTE — ED Notes (Signed)
Pt ambulatory and independent at discharge.  Verbalized understanding of discharge instructions 

## 2016-03-29 ENCOUNTER — Other Ambulatory Visit: Payer: Self-pay | Admitting: Gastroenterology

## 2016-03-29 ENCOUNTER — Telehealth: Payer: Self-pay | Admitting: Gastroenterology

## 2016-03-29 MED ORDER — CIPROFLOXACIN HCL 500 MG PO TABS: 500.0000 mg | ORAL_TABLET | Freq: Two times a day (BID) | ORAL | 0 refills | Status: DC

## 2016-03-29 NOTE — Telephone Encounter (Signed)
hes having intermittent fevers and has been for 2-3 days.  No abd pains.  He went to ER 3 days ago, lfts were elevated but no WBC.  CT scan essentially normal.  He was told to follow up with Dr. Elnoria HowardHung as outpaitent and actually has an appt with Dr. Elnoria HowardHung on Monday.    ER sent him home, presumed this to be viral. That may indeed be true but I'm also concerned about possible retained CBD stones.  He says he feels fine besides fever.   I'm calling him in cipro 500mg  pill, he'll start it tonight.  Still plans on seeing Dr. Elnoria HowardHung on Monday.  He knows if he feels worse, fevers not improving or with abd pains that he will need to go back to ER.

## 2016-03-29 NOTE — Progress Notes (Signed)
Called into the correct pharmacy now

## 2016-03-30 ENCOUNTER — Encounter (HOSPITAL_COMMUNITY): Payer: Self-pay

## 2016-03-30 ENCOUNTER — Inpatient Hospital Stay (HOSPITAL_COMMUNITY)
Admission: EM | Admit: 2016-03-30 | Discharge: 2016-04-04 | DRG: 391 | Disposition: A | Discharge status: Home / Self Care | Payer: 59 | Attending: Internal Medicine | Admitting: Internal Medicine

## 2016-03-30 DIAGNOSIS — R6510 Systemic inflammatory response syndrome (SIRS) of non-infectious origin without acute organ dysfunction: Secondary | ICD-10-CM | POA: Diagnosis present

## 2016-03-30 DIAGNOSIS — Z9049 Acquired absence of other specified parts of digestive tract: Secondary | ICD-10-CM

## 2016-03-30 DIAGNOSIS — R7989 Other specified abnormal findings of blood chemistry: Secondary | ICD-10-CM | POA: Diagnosis present

## 2016-03-30 DIAGNOSIS — E8779 Other fluid overload: Secondary | ICD-10-CM | POA: Diagnosis present

## 2016-03-30 DIAGNOSIS — R1110 Vomiting, unspecified: Secondary | ICD-10-CM

## 2016-03-30 DIAGNOSIS — M545 Low back pain, unspecified: Secondary | ICD-10-CM

## 2016-03-30 DIAGNOSIS — H53149 Visual discomfort, unspecified: Secondary | ICD-10-CM | POA: Diagnosis present

## 2016-03-30 DIAGNOSIS — E872 Acidosis: Secondary | ICD-10-CM | POA: Diagnosis present

## 2016-03-30 DIAGNOSIS — R197 Diarrhea, unspecified: Secondary | ICD-10-CM | POA: Diagnosis present

## 2016-03-30 DIAGNOSIS — E860 Dehydration: Secondary | ICD-10-CM | POA: Diagnosis present

## 2016-03-30 DIAGNOSIS — R739 Hyperglycemia, unspecified: Secondary | ICD-10-CM | POA: Diagnosis present

## 2016-03-30 DIAGNOSIS — N179 Acute kidney failure, unspecified: Secondary | ICD-10-CM | POA: Diagnosis present

## 2016-03-30 DIAGNOSIS — H578 Other specified disorders of eye and adnexa: Secondary | ICD-10-CM | POA: Diagnosis present

## 2016-03-30 DIAGNOSIS — R740 Nonspecific elevation of levels of transaminase and lactic acid dehydrogenase [LDH]: Secondary | ICD-10-CM | POA: Diagnosis present

## 2016-03-30 DIAGNOSIS — D509 Iron deficiency anemia, unspecified: Secondary | ICD-10-CM | POA: Diagnosis present

## 2016-03-30 DIAGNOSIS — R112 Nausea with vomiting, unspecified: Secondary | ICD-10-CM | POA: Diagnosis not present

## 2016-03-30 DIAGNOSIS — Z833 Family history of diabetes mellitus: Secondary | ICD-10-CM

## 2016-03-30 DIAGNOSIS — Z6833 Body mass index (BMI) 33.0-33.9, adult: Secondary | ICD-10-CM

## 2016-03-30 DIAGNOSIS — N170 Acute kidney failure with tubular necrosis: Secondary | ICD-10-CM | POA: Diagnosis present

## 2016-03-30 DIAGNOSIS — Z880 Allergy status to penicillin: Secondary | ICD-10-CM

## 2016-03-30 DIAGNOSIS — E871 Hypo-osmolality and hyponatremia: Secondary | ICD-10-CM | POA: Diagnosis present

## 2016-03-30 DIAGNOSIS — R509 Fever, unspecified: Secondary | ICD-10-CM | POA: Diagnosis present

## 2016-03-30 DIAGNOSIS — E669 Obesity, unspecified: Secondary | ICD-10-CM | POA: Diagnosis present

## 2016-03-30 DIAGNOSIS — R945 Abnormal results of liver function studies: Secondary | ICD-10-CM

## 2016-03-30 DIAGNOSIS — R718 Other abnormality of red blood cells: Secondary | ICD-10-CM | POA: Diagnosis present

## 2016-03-30 HISTORY — DX: Cholecystitis, unspecified: K81.9

## 2016-03-30 MED ORDER — IBUPROFEN 200 MG PO TABS
400.0000 mg | ORAL_TABLET | Freq: Once | ORAL | Status: AC
  Administered 2016-03-30: 400 mg via ORAL
  Filled 2016-03-30: qty 2

## 2016-03-30 MED ORDER — ACETAMINOPHEN 325 MG PO TABS
650.0000 mg | ORAL_TABLET | Freq: Once | ORAL | Status: DC | PRN
  Filled 2016-03-30: qty 2

## 2016-03-30 NOTE — ED Provider Notes (Addendum)
[facility]-EMERGENCY DEPT Provider Note   CSN: [id number] Arrival date & time: 03/30/16  2305 By signing my name below, I, Bridgette Habermann, attest that this documentation has been prepared under the direction and in the presence of Cathren Laine, MD. Electronically Signed: Bridgette Habermann, ED Scribe. 03/30/16. 12:29 AM.  History   Chief Complaint Chief Complaint  Patient presents with  . Fever  . Back Pain   HPI Comments: Jason Villarreal is a 25 y.o. male who presents to the Emergency Department complaining of 6/10 lower back pain onset one week ago. Pt also has associated diarrhea, nausea, fatigue, and fever. Pt was seen on 03/26/16 for the same symptoms and was diagnosed with elevated liver enzymes. Pt had an abdomen CT done which was unremarkable. He was discharged with Prilosec, nausea medicine, and a GI referral but pt notes his symptoms have been persistent and he could not get an appointment with the GI doc until this upcoming week. Pt has h/o cholecystectomy. Pt denies numbness, weakness, abdominal pain, rash, testicular pain, dysuria, nausea, vomiting, or any other associated symptoms.  Patient currently denies abdominal pain. Has noted lower back pain. Mild, dull, non radiating, no leg pain, no numbness/weakness. No back injury or strain. No gu c/o.      The history is provided by the patient. No language interpreter was used.    History reviewed. No pertinent past medical history.  Patient Active Problem List   Diagnosis Date Noted  . Calculous cholecystitis with obstruction 04/14/2015    Past Surgical History:  Procedure Laterality Date  . CHOLECYSTECTOMY N/A 04/16/2015   Procedure: LAPAROSCOPIC CHOLECYSTECTOMY ;  Surgeon: Ovidio Kin, MD;  Location: [facility];  Service: General;  Laterality: N/A;       Home Medications    Prior to Admission medications   Medication Sig Start Date End Date Taking? Authorizing Provider  acetaminophen (TYLENOL) 500 MG tablet Take 500 mg by mouth every  6 (six) hours as needed for mild pain.    Historical Provider, MD  ciprofloxacin (CIPRO) 500 MG tablet Take 1 tablet (500 mg total) by mouth 2 (two) times daily. 03/29/16   Rachael Fee, MD  dicyclomine (BENTYL) 10 MG capsule Take 1 pill before meals and at bedtime only if needed for abdominal pain. Patient not taking: Reported on 03/27/2016 04/13/15   Peyton Najjar, MD  HYDROcodone-acetaminophen (NORCO/VICODIN) 5-325 MG tablet Take 1-2 tablets by mouth every 4 (four) hours as needed for moderate pain. Patient not taking: Reported on 03/27/2016 04/18/15   Barnetta Chapel, PA-C  omeprazole (PRILOSEC) 20 MG capsule Take 1 capsule (20 mg total) by mouth daily. 03/27/16   April Palumbo, MD  ondansetron [facility] ODT) 8 MG disintegrating tablet 8mg  ODT q8 hours prn nausea 03/27/16   April Palumbo, MD    Family History Family History  Problem Relation Age of Onset  . Diabetes Father     Social History Social History  Substance Use Topics  . Smoking status: Never Smoker  . Smokeless tobacco: Never Used  . Alcohol use No     Allergies   Penicillins   Review of Systems Review of Systems  Constitutional: Positive for fatigue and fever.  HENT: Negative for sore throat.   Eyes: Negative for discharge.  Respiratory: Negative for cough and shortness of breath.   Cardiovascular: Negative for chest pain and leg swelling.  Gastrointestinal: Positive for diarrhea and nausea. Negative for abdominal pain and vomiting.  Genitourinary: Negative for dysuria and testicular pain.  Musculoskeletal: Positive for back pain. Negative for neck pain and neck stiffness.  Skin: Negative for rash.  Neurological: Negative for weakness and numbness.  Psychiatric/Behavioral: Negative for confusion.   Physical Exam Updated Vital Signs BP 131/88 (BP Location: Right Arm)   Pulse 93   Temp 100.6 F (38.1 C) (Oral)   Resp 18   SpO2 99%   Physical Exam  Constitutional: He appears well-developed and  well-nourished.  Febrile  HENT:  Mouth/Throat: Oropharynx is clear and moist.  Eyes: Conjunctivae are normal.  bilater conj mildly injected. No exudate. No eye pain or pain w eom.   Neck: Normal range of motion. Neck supple.  No stiffness or rigidity.   Cardiovascular: Normal rate, regular rhythm, normal heart sounds and intact distal pulses.  Exam reveals no gallop and no friction rub.   No murmur heard. Pulmonary/Chest: Effort normal and breath sounds normal. No respiratory distress.  Abdominal: Soft. Bowel sounds are normal. He exhibits no distension and no mass. There is no tenderness. There is no rebound and no guarding. No hernia.  Genitourinary:  Genitourinary Comments: No cva tenderness  Musculoskeletal: He exhibits no edema or tenderness.  CTLS spine, non tender, aligned, no step off. No focal tenderness, sts, or skin changes on back exam, spine nt.   Neurological: He is alert.  Speech clear/normal. Motor intact bil steady gait.   Skin: Skin is warm and dry. No rash noted. No erythema.  Psychiatric: He has a normal mood and affect. His behavior is normal.  Nursing note and vitals reviewed.    ED Treatments / Results  DIAGNOSTIC STUDIES: Oxygen Saturation is 99% on RA, normal by my interpretation.    COORDINATION OF CARE: 12:27 AM Discussed treatment plan with pt at bedside which includes IV fluids and pt agreed to plan.  Labs (all labs ordered are listed, but only abnormal results are displayed) Results for orders placed or performed during the hospital encounter of 03/30/16  Comprehensive metabolic panel  Result Value Ref Range   Sodium 126 (L) 135 - 145 mmol/L   Potassium 3.5 3.5 - 5.1 mmol/L   Chloride 92 (L) 101 - 111 mmol/L   CO2 23 22 - 32 mmol/L   Glucose, Bld 118 (H) 65 - 99 mg/dL   BUN 11 6 - 20 mg/dL   Creatinine, Ser 4.091.50 (H) 0.61 - 1.24 mg/dL   Calcium 8.4 (L) 8.9 - 10.3 mg/dL   Total Protein 7.8 6.5 - 8.1 g/dL   Albumin 3.9 3.5 - 5.0 g/dL   AST 811145  (H) 15 - 41 U/L   ALT 181 (H) 17 - 63 U/L   Alkaline Phosphatase 208 (H) 38 - 126 U/L   Total Bilirubin 1.9 (H) 0.3 - 1.2 mg/dL   GFR calc non Af Amer >60 >60 mL/min   GFR calc Af Amer >60 >60 mL/min   Anion gap 11 5 - 15  CBC with Differential/Platelet  Result Value Ref Range   WBC 15.1 (H) 4.0 - 10.5 K/uL   RBC 4.32 4.22 - 5.81 MIL/uL   Hemoglobin 11.7 (L) 13.0 - 17.0 g/dL   HCT 91.431.6 (L) 78.239.0 - 95.652.0 %   MCV 73.1 (L) 78.0 - 100.0 fL   MCH 27.1 26.0 - 34.0 pg   MCHC 37.0 (H) 30.0 - 36.0 g/dL   RDW 21.313.8 08.611.5 - 57.815.5 %   Platelets 298 150 - 400 K/uL   Neutrophils Relative % 85 %   Lymphocytes Relative 5 %   Monocytes  Relative 7 %   Eosinophils Relative 2 %   Basophils Relative 1 %   Neutro Abs 12.7 (H) 1.7 - 7.7 K/uL   Lymphs Abs 0.8 0.7 - 4.0 K/uL   Monocytes Absolute 1.1 (H) 0.1 - 1.0 K/uL   Eosinophils Absolute 0.3 0.0 - 0.7 K/uL   Basophils Absolute 0.2 (H) 0.0 - 0.1 K/uL   RBC Morphology SPHEROCYTES   Urinalysis, Routine w reflex microscopic (not at [facility])  Result Value Ref Range   Color, Urine YELLOW YELLOW   APPearance CLEAR CLEAR   Specific Gravity, Urine 1.006 1.005 - 1.030   pH 6.0 5.0 - 8.0   Glucose, UA NEGATIVE NEGATIVE mg/dL   Hgb urine dipstick MODERATE (A) NEGATIVE   Bilirubin Urine NEGATIVE NEGATIVE   Ketones, ur NEGATIVE NEGATIVE mg/dL   Protein, ur 30 (A) NEGATIVE mg/dL   Nitrite NEGATIVE NEGATIVE   Leukocytes, UA NEGATIVE NEGATIVE  Lipase, blood  Result Value Ref Range   Lipase 34 11 - 51 U/L  Urine microscopic-add on  Result Value Ref Range   Squamous Epithelial / LPF NONE SEEN NONE SEEN   WBC, UA 0-5 0 - 5 WBC/hpf   RBC / HPF 0-5 0 - 5 RBC/hpf   Bacteria, UA RARE (A) NONE SEEN  I-Stat CG4 Lactic Acid, ED  Result Value Ref Range   Lactic Acid, Venous 1.26 0.5 - 1.9 mmol/L   Ct Abdomen Pelvis W Contrast  Result Date: 03/27/2016 CLINICAL DATA:  Acute onset of nausea, vomiting and diarrhea. Initial encounter. EXAM: CT ABDOMEN AND PELVIS WITH  CONTRAST TECHNIQUE: Multidetector CT imaging of the abdomen and pelvis was performed using the standard protocol following bolus administration of intravenous contrast. CONTRAST:  ISOVUE-300 IOPAMIDOL (ISOVUE-300) INJECTION 61% COMPARISON:  Abdominal ultrasound performed 04/14/2015 FINDINGS: Lower chest: The visualized lung bases are grossly clear. The visualized portions of the mediastinum are unremarkable. Hepatobiliary: The liver is unremarkable in appearance. The patient is status post cholecystectomy, with clips noted at the gallbladder fossa. The common bile duct remains normal in caliber. Pancreas: The pancreas is within normal limits. Spleen: The spleen is unremarkable in appearance. Adrenals/Urinary Tract: The adrenal glands are unremarkable in appearance. The kidneys are within normal limits. There is no evidence of hydronephrosis. No renal or ureteral stones are identified. No perinephric stranding is seen. Stomach/Bowel: The stomach is unremarkable in appearance. The small bowel is within normal limits. The appendix is normal in caliber, without evidence of appendicitis. The colon is unremarkable in appearance. Vascular/Lymphatic: The abdominal aorta is unremarkable in appearance. The inferior vena cava is grossly unremarkable. No retroperitoneal lymphadenopathy is seen. No pelvic sidewall lymphadenopathy is identified. Reproductive: The bladder is moderately distended and grossly unremarkable. The prostate remains normal in size. Other: No additional soft tissue abnormalities are seen. Musculoskeletal: No acute osseous abnormalities are identified. The visualized musculature is unremarkable in appearance. IMPRESSION: No acute abnormality seen within the abdomen or pelvis. Electronically Signed   By: Roanna Raider M.D.   On: 03/27/2016 02:22   US Abdomen Limited  Result Date: 03/31/2016 CLINICAL DATA:  Increased liver function studies. Fever. Previous cholecystectomy. EXAM: US ABDOMEN LIMITED  - RIGHT UPPER QUADRANT COMPARISON:  CT abdomen and pelvis 03/27/2016 FINDINGS: Gallbladder: The gallbladder is surgically absent. Common bile duct: Diameter: 4.3 mm, normal Liver: No focal lesion identified. Within normal limits in parenchymal echogenicity. IMPRESSION: Surgical absence of the gallbladder. No bile duct dilatation. Normal homogeneous liver parenchymal echotexture. Electronically Signed   By: Marisa Cyphers.D.  On: 03/31/2016 02:51    EKG  EKG Interpretation None       Radiology US Abdomen Limited  Result Date: 03/31/2016 CLINICAL DATA:  Increased liver function studies. Fever. Previous cholecystectomy. EXAM: US ABDOMEN LIMITED - RIGHT UPPER QUADRANT COMPARISON:  CT abdomen and pelvis 03/27/2016 FINDINGS: Gallbladder: The gallbladder is surgically absent. Common bile duct: Diameter: 4.3 mm, normal Liver: No focal lesion identified. Within normal limits in parenchymal echogenicity. IMPRESSION: Surgical absence of the gallbladder. No bile duct dilatation. Normal homogeneous liver parenchymal echotexture. Electronically Signed   By: Burman Nieves M.D.   On: 03/31/2016 02:51    Procedures Procedures (including critical care time)  Medications Ordered in ED Medications  acetaminophen (TYLENOL) tablet 650 mg (650 mg Oral Not Given 03/30/16 2328)  ibuprofen (ADVIL,MOTRIN) tablet 400 mg (400 mg Oral Given 03/30/16 2334)     Initial Impression / Assessment and Plan / ED Course  I have reviewed the triage vital signs and the nursing notes.  Pertinent labs & imaging results that were available during my care of the patient were reviewed by me and considered in my medical decision making (see chart for details).  Clinical Course    Iv ns. Labs. Cultures.   U/s.   Reviewed nursing notes and prior charts for additional history. Recent ct abd negative acute.   Iv ns bolus.  lfts elevated, but sl decreased from prior.  Na is now low as compared to prior, and cre  elevated.  Additional ns iv.  Cause symptoms not entirely clear, ?viral process verses other.  cxr is pending.   As other new/localizing symptom is low back pain, mri r/o osteo/disiitis may be reasonable, will plan to admit (given hyponatremia, aki, and admitting team can decide on this AM, when mri available, if back pain persists, and no other source infxn).   Hospitalists consulted for admission.  cxr remains pending.  cxr c/w mid lung infiltrate/pna. Iv abx ordered.        Final Clinical Impressions(s) / ED Diagnoses   Final diagnoses:  None   I personally performed the services described in this documentation, which was scribed in my presence. The recorded information has been reviewed and considered. Cathren Laine, MD   New Prescriptions New Prescriptions   No medications on file         Cathren Laine, MD 03/31/16 [phone]

## 2016-03-30 NOTE — ED Triage Notes (Signed)
Pt presents with c/o pain in his lower back. Pt reports that he was seen several days ago for same and diagnosed elevated liver enzymes. Pt was discharged with Prilosec and meds for nausea and a recommendation for a GI doc. Pt does have an appt on Monday with her GI doc. Pt also reporting diarrhea, no nausea, and a fever. Pt is febrile at this time, 100.6.

## 2016-03-31 ENCOUNTER — Emergency Department (HOSPITAL_COMMUNITY): Payer: 59

## 2016-03-31 ENCOUNTER — Encounter (HOSPITAL_COMMUNITY): Payer: Self-pay | Admitting: Internal Medicine

## 2016-03-31 DIAGNOSIS — A419 Sepsis, unspecified organism: Secondary | ICD-10-CM | POA: Insufficient documentation

## 2016-03-31 DIAGNOSIS — R1110 Vomiting, unspecified: Secondary | ICD-10-CM | POA: Insufficient documentation

## 2016-03-31 DIAGNOSIS — N179 Acute kidney failure, unspecified: Secondary | ICD-10-CM | POA: Diagnosis not present

## 2016-03-31 DIAGNOSIS — R197 Diarrhea, unspecified: Secondary | ICD-10-CM | POA: Diagnosis not present

## 2016-03-31 DIAGNOSIS — M545 Low back pain, unspecified: Secondary | ICD-10-CM | POA: Insufficient documentation

## 2016-03-31 DIAGNOSIS — R112 Nausea with vomiting, unspecified: Principal | ICD-10-CM | POA: Diagnosis present

## 2016-03-31 DIAGNOSIS — E871 Hypo-osmolality and hyponatremia: Secondary | ICD-10-CM | POA: Diagnosis not present

## 2016-03-31 DIAGNOSIS — R509 Fever, unspecified: Secondary | ICD-10-CM | POA: Insufficient documentation

## 2016-03-31 DIAGNOSIS — M549 Dorsalgia, unspecified: Secondary | ICD-10-CM | POA: Insufficient documentation

## 2016-03-31 DIAGNOSIS — R7989 Other specified abnormal findings of blood chemistry: Secondary | ICD-10-CM | POA: Diagnosis present

## 2016-03-31 DIAGNOSIS — R748 Abnormal levels of other serum enzymes: Secondary | ICD-10-CM | POA: Diagnosis not present

## 2016-03-31 DIAGNOSIS — R945 Abnormal results of liver function studies: Secondary | ICD-10-CM

## 2016-03-31 DIAGNOSIS — G43A0 Cyclical vomiting, not intractable: Secondary | ICD-10-CM

## 2016-03-31 LAB — COMPREHENSIVE METABOLIC PANEL
ALBUMIN: 3.9 g/dL (ref 3.5–5.0)
ALT: 181 U/L — AB (ref 17–63)
AST: 145 U/L — ABNORMAL HIGH (ref 15–41)
Alkaline Phosphatase: 208 U/L — ABNORMAL HIGH (ref 38–126)
Anion gap: 11 (ref 5–15)
BUN: 11 mg/dL (ref 6–20)
CHLORIDE: 92 mmol/L — AB (ref 101–111)
CO2: 23 mmol/L (ref 22–32)
CREATININE: 1.5 mg/dL — AB (ref 0.61–1.24)
Calcium: 8.4 mg/dL — ABNORMAL LOW (ref 8.9–10.3)
GFR calc non Af Amer: >60 mL/min (ref >60)
Glucose, Bld: 118 mg/dL — ABNORMAL HIGH (ref 65–99)
Potassium: 3.5 mmol/L (ref 3.5–5.1)
SODIUM: 126 mmol/L — AB (ref 135–145)
Total Bilirubin: 1.9 mg/dL — ABNORMAL HIGH (ref 0.3–1.2)
Total Protein: 7.8 g/dL (ref 6.5–8.1)

## 2016-03-31 LAB — PROTIME-INR
INR: 1.08
PROTHROMBIN TIME: 14 s (ref 11.4–15.2)

## 2016-03-31 LAB — URINE MICROSCOPIC-ADD ON: Squamous Epithelial / LPF: NONE SEEN

## 2016-03-31 LAB — GASTROINTESTINAL PANEL BY PCR, STOOL (REPLACES STOOL CULTURE)
ASTROVIRUS: NOT DETECTED
Adenovirus F40/41: NOT DETECTED
CYCLOSPORA CAYETANENSIS: NOT DETECTED
Campylobacter species: NOT DETECTED
Cryptosporidium: NOT DETECTED
ENTAMOEBA HISTOLYTICA: NOT DETECTED
ENTEROAGGREGATIVE E COLI (EAEC): NOT DETECTED
ENTEROPATHOGENIC E COLI (EPEC): NOT DETECTED
ENTEROTOXIGENIC E COLI (ETEC): NOT DETECTED
Giardia lamblia: NOT DETECTED
Norovirus GI/GII: NOT DETECTED
Plesimonas shigelloides: NOT DETECTED
ROTAVIRUS A: NOT DETECTED
SHIGA LIKE TOXIN PRODUCING E COLI (STEC): NOT DETECTED
SHIGELLA/ENTEROINVASIVE E COLI (EIEC): NOT DETECTED
Salmonella species: NOT DETECTED
Sapovirus (I, II, IV, and V): NOT DETECTED
VIBRIO SPECIES: NOT DETECTED
Vibrio cholerae: NOT DETECTED
YERSINIA ENTEROCOLITICA: NOT DETECTED

## 2016-03-31 LAB — BASIC METABOLIC PANEL
ANION GAP: 10 (ref 5–15)
BUN: 12 mg/dL (ref 6–20)
CHLORIDE: 96 mmol/L — AB (ref 101–111)
CO2: 25 mmol/L (ref 22–32)
Calcium: 8 mg/dL — ABNORMAL LOW (ref 8.9–10.3)
Creatinine, Ser: 1.57 mg/dL — ABNORMAL HIGH (ref 0.61–1.24)
GFR, EST NON AFRICAN AMERICAN: 59 mL/min — AB (ref >60)
Glucose, Bld: 131 mg/dL — ABNORMAL HIGH (ref 65–99)
POTASSIUM: 3.8 mmol/L (ref 3.5–5.1)
SODIUM: 131 mmol/L — AB (ref 135–145)

## 2016-03-31 LAB — CBC WITH DIFFERENTIAL/PLATELET
BASOS PCT: 1 %
Basophils Absolute: 0.2 10*3/uL — ABNORMAL HIGH (ref 0.0–0.1)
EOS PCT: 2 %
Eosinophils Absolute: 0.3 10*3/uL (ref 0.0–0.7)
HCT: 31.6 % — ABNORMAL LOW (ref 39.0–52.0)
HEMOGLOBIN: 11.7 g/dL — AB (ref 13.0–17.0)
LYMPHS PCT: 5 %
Lymphs Abs: 0.8 10*3/uL (ref 0.7–4.0)
MCH: 27.1 pg (ref 26.0–34.0)
MCHC: 37 g/dL — ABNORMAL HIGH (ref 30.0–36.0)
MCV: 73.1 fL — AB (ref 78.0–100.0)
MONO ABS: 1.1 10*3/uL — AB (ref 0.1–1.0)
Monocytes Relative: 7 %
NEUTROS PCT: 85 %
Neutro Abs: 12.7 10*3/uL — ABNORMAL HIGH (ref 1.7–7.7)
Platelets: 298 10*3/uL (ref 150–400)
RBC: 4.32 MIL/uL (ref 4.22–5.81)
RDW: 13.8 % (ref 11.5–15.5)
WBC: 15.1 10*3/uL — ABNORMAL HIGH (ref 4.0–10.5)

## 2016-03-31 LAB — I-STAT CG4 LACTIC ACID, ED: Lactic Acid, Venous: 1.26 mmol/L (ref 0.5–1.9)

## 2016-03-31 LAB — CBC
HEMATOCRIT: 30.8 % — AB (ref 39.0–52.0)
HEMOGLOBIN: 11.4 g/dL — AB (ref 13.0–17.0)
MCH: 27.3 pg (ref 26.0–34.0)
MCHC: 37 g/dL — ABNORMAL HIGH (ref 30.0–36.0)
MCV: 73.7 fL — AB (ref 78.0–100.0)
Platelets: 313 10*3/uL (ref 150–400)
RBC: 4.18 MIL/uL — AB (ref 4.22–5.81)
RDW: 13.8 % (ref 11.5–15.5)
WBC: 16.4 10*3/uL — AB (ref 4.0–10.5)

## 2016-03-31 LAB — LIPASE, BLOOD: Lipase: 34 U/L (ref 11–51)

## 2016-03-31 LAB — URINALYSIS, ROUTINE W REFLEX MICROSCOPIC
Bilirubin Urine: NEGATIVE
GLUCOSE, UA: NEGATIVE mg/dL
Ketones, ur: NEGATIVE mg/dL
Leukocytes, UA: NEGATIVE
Nitrite: NEGATIVE
PH: 6 (ref 5.0–8.0)
Protein, ur: 30 mg/dL — AB
SPECIFIC GRAVITY, URINE: 1.006 (ref 1.005–1.030)

## 2016-03-31 LAB — GLUCOSE, CAPILLARY: Glucose-Capillary: 99 mg/dL (ref 65–99)

## 2016-03-31 LAB — HIV ANTIBODY (ROUTINE TESTING W REFLEX): HIV Screen 4th Generation wRfx: NONREACTIVE

## 2016-03-31 LAB — C DIFFICILE QUICK SCREEN W PCR REFLEX
C DIFFICILE (CDIFF) TOXIN: NEGATIVE
C Diff antigen: NEGATIVE
C Diff interpretation: NOT DETECTED

## 2016-03-31 LAB — TSH: TSH: 1.195 u[IU]/mL (ref 0.350–4.500)

## 2016-03-31 LAB — OSMOLALITY, URINE: OSMOLALITY UR: 98 mosm/kg — AB (ref 300–900)

## 2016-03-31 LAB — SODIUM, URINE, RANDOM: Sodium, Ur: <10 mmol/L

## 2016-03-31 LAB — CREATININE, URINE, RANDOM: Creatinine, Urine: 39.79 mg/dL

## 2016-03-31 LAB — LACTIC ACID, PLASMA
LACTIC ACID, VENOUS: 1.3 mmol/L (ref 0.5–1.9)
Lactic Acid, Venous: 1.2 mmol/L (ref 0.5–1.9)

## 2016-03-31 LAB — STREP PNEUMONIAE URINARY ANTIGEN: STREP PNEUMO URINARY ANTIGEN: NEGATIVE

## 2016-03-31 LAB — OSMOLALITY: OSMOLALITY: 279 mosm/kg (ref 275–295)

## 2016-03-31 LAB — PROCALCITONIN: PROCALCITONIN: 0.66 ng/mL

## 2016-03-31 MED ORDER — ONDANSETRON HCL 4 MG/2ML IJ SOLN
4.0000 mg | Freq: Three times a day (TID) | INTRAMUSCULAR | Status: DC | PRN
Start: 2016-03-31 — End: 2016-04-04
  Administered 2016-03-31: 4 mg via INTRAVENOUS
  Filled 2016-03-31: qty 2

## 2016-03-31 MED ORDER — DICLOFENAC SODIUM 1 % TD GEL
2.0000 g | Freq: Four times a day (QID) | TRANSDERMAL | Status: DC
  Administered 2016-04-01: 2 g via TOPICAL
  Filled 2016-03-31: qty 100

## 2016-03-31 MED ORDER — CIPROFLOXACIN IN D5W 400 MG/200ML IV SOLN: 400.0000 mg | Freq: Two times a day (BID) | INTRAVENOUS | Status: DC

## 2016-03-31 MED ORDER — DEXTROSE 5 % IV SOLN: 2.0000 g | INTRAVENOUS | Status: DC

## 2016-03-31 MED ORDER — SODIUM CHLORIDE 0.9 % IV BOLUS (SEPSIS)
1000.0000 mL | Freq: Once | INTRAVENOUS | Status: AC
  Administered 2016-03-31: 1000 mL via INTRAVENOUS

## 2016-03-31 MED ORDER — METHOCARBAMOL 1000 MG/10ML IJ SOLN
500.0000 mg | Freq: Three times a day (TID) | INTRAVENOUS | Status: DC | PRN
  Filled 2016-03-31: qty 5

## 2016-03-31 MED ORDER — SODIUM CHLORIDE 0.9 % IV SOLN
INTRAVENOUS | Status: DC
  Administered 2016-03-31 – 2016-04-03 (×8): via INTRAVENOUS

## 2016-03-31 MED ORDER — DEXTROSE 5 % IV SOLN
500.0000 mg | Freq: Once | INTRAVENOUS | Status: AC
  Administered 2016-03-31: 500 mg via INTRAVENOUS
  Filled 2016-03-31: qty 500

## 2016-03-31 MED ORDER — OXYCODONE HCL 5 MG PO TABS
5.0000 mg | ORAL_TABLET | Freq: Four times a day (QID) | ORAL | Status: DC | PRN
  Administered 2016-03-31 – 2016-04-01 (×3): 5 mg via ORAL
  Filled 2016-03-31 (×3): qty 1

## 2016-03-31 MED ORDER — ENOXAPARIN SODIUM 40 MG/0.4ML ~~LOC~~ SOLN
40.0000 mg | SUBCUTANEOUS | Status: DC
  Filled 2016-03-31: qty 0.4

## 2016-03-31 MED ORDER — SODIUM CHLORIDE 0.9 % IV BOLUS (SEPSIS): 2000.0000 mL | Freq: Once | INTRAVENOUS | Status: DC

## 2016-03-31 MED ORDER — OXYCODONE HCL 5 MG PO TABS
10.0000 mg | ORAL_TABLET | Freq: Four times a day (QID) | ORAL | Status: DC | PRN
  Administered 2016-03-31: 10 mg via ORAL
  Filled 2016-03-31: qty 2

## 2016-03-31 MED ORDER — FAMOTIDINE IN NACL 20-0.9 MG/50ML-% IV SOLN
20.0000 mg | Freq: Two times a day (BID) | INTRAVENOUS | Status: DC
  Administered 2016-03-31 – 2016-04-02 (×5): 20 mg via INTRAVENOUS
  Filled 2016-03-31 (×5): qty 50

## 2016-03-31 MED ORDER — IBUPROFEN 200 MG PO TABS
400.0000 mg | ORAL_TABLET | ORAL | Status: DC | PRN
  Administered 2016-03-31 – 2016-04-01 (×2): 400 mg via ORAL
  Filled 2016-03-31 (×2): qty 2

## 2016-03-31 MED ORDER — DEXTROSE 5 % IV SOLN
1.0000 g | Freq: Once | INTRAVENOUS | Status: AC
  Administered 2016-03-31: 1 g via INTRAVENOUS
  Filled 2016-03-31: qty 10

## 2016-03-31 NOTE — Progress Notes (Addendum)
PROGRESS NOTE    Jason Villarreal  ZOX:[id number] DOB: April 28, 1991 DOA: 03/30/2016  PCP: No PCP Per Patient   Brief Narrative:  Jason Villarreal is a 25 y.o. male with medical history significant of  Cholecystitis, s/p of cholecystectomy last year, who presents with fever, nausea, vomiting, diarrhea and back pain. Patient was seen in ED on 03/26/16 ED. He had negative CT abdomen/pelvis, but was found to have abnormal liver function. He was given referral to GI, but not seen yet. He states that he was given prescription of ciprofloxacin by GI, Dr. Christella Hartigan two days ago. Per Dr. Christella Hartigan telephone notes, he was concerned bout possible retained CBD stones. He states that he has been taking antibiotics, but symptoms have no improvement.   Subjective: As above  Assessment & Plan:   Principal Problem: Nausea vomiting and diarrhea/ elevated LFTs/ SIRS with fever, leukocytosis - feer 102 2 days ago - LFTs improving - ? Viral - GI has evaluated and have ordered viral studies - cont antiemetics, diet as tolerated  Active Problems:    Back pain - sprain- cont pain meds PRN    Hyponatremia/ AKI (acute kidney injury) - dehydration- cont NS infusion  Anemia -  drop in Hb from 14-15 range last year to 11 now - anemia panel     DVT prophylaxis: Lovenox Code Status: Full  Family Communication: mother Disposition Plan: home when stable Consultants:   GI Procedures:    Antimicrobials:  Anti-infectives    Start     Dose/Rate Route Frequency Ordered Stop   03/31/16 0800  ciprofloxacin (CIPRO) IVPB 400 mg  Status:  Discontinued     400 mg 200 mL/hr over 60 Minutes Intravenous Every 12 hours 03/31/16 0727 03/31/16 0859   03/31/16 0445  cefTRIAXone (ROCEPHIN) 1 g in dextrose 5 % 50 mL IVPB     1 g 100 mL/hr over 30 Minutes Intravenous  Once 03/31/16 0430 03/31/16 0533   03/31/16 0445  azithromycin (ZITHROMAX) 500 mg in dextrose 5 % 250 mL IVPB     500 mg 250 mL/hr over 60 Minutes  Intravenous  Once 03/31/16 0430 03/31/16 0630   03/31/16 0400  aztreonam (AZACTAM) 2 g in dextrose 5 % 50 mL IVPB  Status:  Discontinued     2 g 100 mL/hr over 30 Minutes Intravenous STAT 03/31/16 0353 03/31/16 0439       Objective: Vitals:   03/31/16 0122 03/31/16 0413 03/31/16 0442 03/31/16 0450  BP: 113/63 119/80 121/90   Pulse: 77 72 78   Resp: 19 18 18    Temp: 98.7 F (37.1 C) 98.2 F (36.8 C) 98.2 F (36.8 C)   TempSrc: Oral Oral Oral   SpO2: 99% 98% 100%   Weight:    103.2 kg (227 lb 8.2 oz)  Height:   5\' 9"  (1.753 m)     Intake/Output Summary (Last 24 hours) at 03/31/16 1354 Last data filed at 03/31/16 1113  Gross per 24 hour  Intake             1355 ml  Output                0 ml  Net             1355 ml   Filed Weights   03/31/16 0450  Weight: 103.2 kg (227 lb 8.2 oz)    Examination: General exam: Appears comfortable  HEENT: PERRLA, oral mucosa moist, no sclera icterus or thrush Respiratory system: Clear to  auscultation. Respiratory effort normal. Cardiovascular system: S1 & S2 heard, RRR.  No murmurs  Gastrointestinal system: Abdomen soft, non-tender, nondistended. Normal bowel sound. No organomegaly Central nervous system: Alert and oriented. No focal neurological deficits. Extremities: No cyanosis, clubbing or edema Skin: No rashes or ulcers Psychiatry:  Mood & affect appropriate.     Data Reviewed: I have personally reviewed following labs and imaging studies  CBC:  Recent Labs Lab 03/26/16 2358 03/31/16 0002 03/31/16 0905  WBC 8.8 15.1* 16.4*  NEUTROABS  --  12.7*  --   HGB 14.6 11.7* 11.4*  HCT 40.4 31.6* 30.8*  MCV 74.3* 73.1* 73.7*  PLT 272 298 313   Basic Metabolic Panel:  Recent Labs Lab 03/26/16 2358 03/31/16 0002 03/31/16 0621  NA 132* 126* 131*  K 3.6 3.5 3.8  CL 94* 92* 96*  CO2 27 23 25   GLUCOSE 113* 118* 131*  BUN 10 11 12   CREATININE 1.20 1.50* 1.57*  CALCIUM 9.3 8.4* 8.0*   GFR: Estimated Creatinine  Clearance: 84.4 mL/min (by C-G formula based on SCr of 1.57 mg/dL (H)). Liver Function Tests:  Recent Labs Lab 03/26/16 2358 03/31/16 0002  AST 189* 145*  ALT 214* 181*  ALKPHOS 194* 208*  BILITOT 1.9* 1.9*  PROT 9.3* 7.8  ALBUMIN 4.8 3.9    Recent Labs Lab 03/26/16 2358 03/31/16 0002  LIPASE 23 34   No results for input(s): AMMONIA in the last 168 hours. Coagulation Profile:  Recent Labs Lab 03/31/16 0621  INR 1.08   Cardiac Enzymes: No results for input(s): CKTOTAL, CKMB, CKMBINDEX, TROPONINI in the last 168 hours. BNP (last 3 results) No results for input(s): PROBNP in the last 8760 hours. HbA1C: No results for input(s): HGBA1C in the last 72 hours. CBG:  Recent Labs Lab 03/31/16 0756  GLUCAP 99   Lipid Profile: No results for input(s): CHOL, HDL, LDLCALC, TRIG, CHOLHDL, LDLDIRECT in the last 72 hours. Thyroid Function Tests:  Recent Labs  03/31/16 0905  TSH 1.195   Anemia Panel: No results for input(s): VITAMINB12, FOLATE, FERRITIN, TIBC, IRON, RETICCTPCT in the last 72 hours. Urine analysis:    Component Value Date/Time   COLORURINE YELLOW 03/31/2016 0110   APPEARANCEUR CLEAR 03/31/2016 0110   LABSPEC 1.006 03/31/2016 0110   PHURINE 6.0 03/31/2016 0110   GLUCOSEU NEGATIVE 03/31/2016 0110   HGBUR MODERATE (A) 03/31/2016 0110   BILIRUBINUR NEGATIVE 03/31/2016 0110   BILIRUBINUR moderate (A) 04/13/2015 0838   KETONESUR NEGATIVE 03/31/2016 0110   PROTEINUR 30 (A) 03/31/2016 0110   UROBILINOGEN 0.2 04/14/2015 1929   NITRITE NEGATIVE 03/31/2016 0110   LEUKOCYTESUR NEGATIVE 03/31/2016 0110   Sepsis Labs: @LABRCNTIP (procalcitonin:4,lacticidven:4) ) Recent Results (from the past 240 hour(s))  C difficile quick scan w PCR reflex     Status: None   Collection Time: 03/31/16  3:47 AM  Result Value Ref Range Status   C Diff antigen NEGATIVE NEGATIVE Final   C Diff toxin NEGATIVE NEGATIVE Final   C Diff interpretation No C. difficile detected.   Final  Gastrointestinal Panel by PCR , Stool     Status: None   Collection Time: 03/31/16  3:47 AM  Result Value Ref Range Status   Campylobacter species NOT DETECTED NOT DETECTED Final   Plesimonas shigelloides NOT DETECTED NOT DETECTED Final   Salmonella species NOT DETECTED NOT DETECTED Final   Yersinia enterocolitica NOT DETECTED NOT DETECTED Final   Vibrio species NOT DETECTED NOT DETECTED Final   Vibrio cholerae NOT DETECTED NOT DETECTED  Final   Enteroaggregative E coli (EAEC) NOT DETECTED NOT DETECTED Final   Enteropathogenic E coli (EPEC) NOT DETECTED NOT DETECTED Final   Enterotoxigenic E coli (ETEC) NOT DETECTED NOT DETECTED Final   Shiga like toxin producing E coli (STEC) NOT DETECTED NOT DETECTED Final   Shigella/Enteroinvasive E coli (EIEC) NOT DETECTED NOT DETECTED Final   Cryptosporidium NOT DETECTED NOT DETECTED Final   Cyclospora cayetanensis NOT DETECTED NOT DETECTED Final   Entamoeba histolytica NOT DETECTED NOT DETECTED Final   Giardia lamblia NOT DETECTED NOT DETECTED Final   Adenovirus F40/41 NOT DETECTED NOT DETECTED Final   Astrovirus NOT DETECTED NOT DETECTED Final   Norovirus GI/GII NOT DETECTED NOT DETECTED Final   Rotavirus A NOT DETECTED NOT DETECTED Final   Sapovirus (I, II, IV, and V) NOT DETECTED NOT DETECTED Final         Radiology Studies: Dg Chest 2 View  Result Date: 03/31/2016 CLINICAL DATA:  Acute onset of fever and lower back pain. Initial encounter. EXAM: CHEST  2 VIEW COMPARISON:  None. FINDINGS: The lungs are well-aerated. Left midlung opacity raises concern for pneumonia. There is no evidence of pleural effusion or pneumothorax. The heart is normal in size; the mediastinal contour is within normal limits. No acute osseous abnormalities are seen. IMPRESSION: Left midlung opacity raises concern for pneumonia. Electronically Signed   By: Roanna RaiderJeffery  Chang M.D.   On: 03/31/2016 03:33   [place] Abdomen Limited  Result Date: 03/31/2016 CLINICAL DATA:   Increased liver function studies. Fever. Previous cholecystectomy. EXAM: US ABDOMEN LIMITED - RIGHT UPPER QUADRANT COMPARISON:  CT abdomen and pelvis 03/27/2016 FINDINGS: Gallbladder: The gallbladder is surgically absent. Common bile duct: Diameter: 4.3 mm, normal Liver: No focal lesion identified. Within normal limits in parenchymal echogenicity. IMPRESSION: Surgical absence of the gallbladder. No bile duct dilatation. Normal homogeneous liver parenchymal echotexture. Electronically Signed   By: Burman NievesWilliam  Stevens M.D.   On: 03/31/2016 02:51      Scheduled Meds: . enoxaparin (LOVENOX) injection  40 mg Subcutaneous Q24H  . famotidine (PEPCID) IV  20 mg Intravenous Q12H   Continuous Infusions: . sodium chloride 75 mL/hr at 03/31/16 0448     LOS: 0 days    Time spent in minutes: 35    Atiyah Bauer, MD [facility] Pager: [email or web address] Password [email or web address] 03/31/2016, 1:54 PM

## 2016-03-31 NOTE — ED Notes (Signed)
US at bedside

## 2016-03-31 NOTE — Consult Note (Addendum)
[facility] Consult (covering for Drs. Nicholes Mango this weekend) 8:13 AM 03/31/2016  LOS: 0 days    Referring Provider: Jeri Lager  Primary Care Physician:  No PCP Per Patient Primary Gastroenterologist:  Dr. Elnoria Howard    Reason for Consultation:  Fevers, elevated LFTs.   HPI: Jason Villarreal is a 25 y.o. male.  PMH: s/p lap chole 04/2015 with preop ERCP, CBD stone extraction by Dr Elnoria Howard 04/16/15.    ~ 2 weeks ago started having flu-like sxs with myalgias, fevers.  Slowly lost appetite, fevers continued then came sweats, nausea, diarrhea (4 to 5 loose, non-bloody stools per day).  Not a lot of vomiting.  Some non-severe abdominal pain.  No cough, no dyspnea, no nasal drainage, no sore throat.  Fever did not abate (up to 102) with tylenol nor with Nyquil, used these once or twice.  sxs worse in last several days.  Had eaten Sushi on 9/18, vomitted this but the sxs predate the sushi.    Went to ED on 9/19 where viral illness suspected, LFTs were elevated but WBCs normal. .  Added Omeprazole, zofran, bland diet.  Was going to follow up with Dr Elnoria Howard on 9/25 but sxs persist.  On 9/22 Cipro added over phone by Dr Christella Hartigan for ? Cholangitis.  Presented to ED last night, having lower back pain, ongoing fevers, malaise, N/V.  Not much belly pain.  No family hx of liver disease or anemias.   LFTs elevated:   Ref. Range 03/26/2016 23:58 03/31/2016 00:02 03/31/2016 00:33  Alkaline Phosphatase Latest Ref Range: 38 - 126 U/L 194 (H) 208 (H)   Albumin Latest Ref Range: 3.5 - 5.0 g/dL 4.8 3.9   Lipase Latest Ref Range: 11 - 51 U/L 23 34   AST Latest Ref Range: 15 - 41 U/L 189 (H) 145 (H)   ALT Latest Ref Range: 17 - 63 U/L 214 (H) 181 (H)   Total Protein Latest Ref Range: 6.5 - 8.1 g/dL 9.3 (H) 7.8   Total Bilirubin Latest Ref Range:  0.3 - 1.2 mg/dL 1.9 (H) 1.9 (H)    Na went from132 to 126.  Creatinine is up.  WBCs elevated from 8.8 to 15.1  Hgb 14.6 to 11.7.  MCV is low, 73.  Platelets normal.   U/A unremarkable C diff negative.  Pending are blood clx, HIV, acute hepatitis panel, legionella, urinary strep pneumo,   03/27/16 CT abdomen negative.  9/24 ultrasound normal.   CXR suspicious for left sided PNA.   Abdominal ultrasound normal post chole.  MRCP ordered.   Has started on Zithrmax for CAP, cipro continues in case of cholangitis.  .   No sick contacts.  Works as Passenger transport manager at [employer].  No ETOH.  No red meat or pork but eats fish, chicken and vegetables.    Past Medical History:  Diagnosis Date  . Cholecystitis     Past Surgical History:  Procedure Laterality Date  . CHOLECYSTECTOMY N/A 04/16/2015   Procedure: LAPAROSCOPIC CHOLECYSTECTOMY ;  Surgeon: Ovidio Kin, MD;  Location:  [facility];  Service: General;  Laterality: N/A;    Prior to Admission medications   Medication Sig Start Date End Date Taking? Authorizing Provider  acetaminophen (TYLENOL) 500 MG tablet Take 500 mg by mouth every 6 (six) hours as needed for mild pain.   Yes Historical Provider, MD  ciprofloxacin (CIPRO) 500 MG tablet Take 1 tablet (500 mg total) by mouth 2 (two) times daily. 03/29/16  Yes Rachael Feeaniel P Jacobs, MD  omeprazole (PRILOSEC) 20 MG capsule Take 1 capsule (20 mg total) by mouth daily. 03/27/16  Yes April Palumbo, MD  ondansetron [facility](ZOFRAN ODT) 8 MG disintegrating tablet 8mg  ODT q8 hours prn nausea 03/27/16  Yes April Palumbo, MD  dicyclomine (BENTYL) 10 MG capsule Take 1 pill before meals and at bedtime only if needed for abdominal pain. Patient not taking: Reported on 03/27/2016 04/13/15   Peyton Najjaravid H Hopper, MD    Scheduled Meds: . ciprofloxacin  400 mg Intravenous Q12H  . enoxaparin (LOVENOX) injection  40 mg Subcutaneous Q24H  . famotidine (PEPCID) IV  20 mg Intravenous Q12H   Infusions: . sodium chloride 75  mL/hr at 03/31/16 0448   PRN Meds: methocarbamol (ROBAXIN)  IV, ondansetron, oxyCODONE   Allergies as of 03/30/2016 - Review Complete 03/30/2016  Allergen Reaction Noted  . Penicillins  03/29/2015    Family History  Problem Relation Age of Onset  . Diabetes Father     Social History   Social History  . Marital status: Single    Spouse name: N/A  . Number of children: N/A  . Years of education: N/A   Occupational History  . Not on file.   Social History Main Topics  . Smoking status: Never Smoker  . Smokeless tobacco: Never Used  . Alcohol use No  . Drug use: Unknown  . Sexual activity: Not on file   Other Topics Concern  . Not on file   Social History Narrative   Senior at [employer]    REVIEW OF SYSTEMS: Constitutional:  fa ENT:  No nose bleeds Pulm:  No COB or cough CV:  No palpitations, no LE edema.  GU:  No hematuria, no frequency.  No teal color urine or oliguria  GI:  Per HPI Heme:  No unusual bleeding or bruising   Transfusions:  none Neuro:  No headaches, no peripheral tingling or numbness Derm:  No itching, no rash or sores.  Endocrine:  No sweats or chills.  No polyuria or dysuria Immunization:  Not queried.  Travel:  None beyond local counties in last few months.    PHYSICAL EXAM: Vital signs in last 24 hours: Vitals:   03/31/16 0413 03/31/16 0442  BP: 119/80 121/90  Pulse: 72 78  Resp: 18 18  Temp: 98.2 F (36.8 C) 98.2 F (36.8 C)   Wt Readings from Last 3 Encounters:  03/31/16 103.2 kg (227 lb 8.2 oz)  04/15/15 97.4 kg (214 lb 11.2 oz)  04/13/15 99.3 kg (219 lb)   General: overweight, pleasant, comfortable but somewhat ill looking Head:  No asymmetry, swelling or trauma  Eyes:  No icterus, no pallor Ears:  Not HOH  Nose:  No congestion or discharge Mouth:  Clear and moist Neck:  No mass or JVD Lungs:  Clear bil.   Heart: RRR.  No MRG Abdomen:  Soft, NT, ND.  BS active.  .   Rectal: deferred   Musc/Skeltl: no joint  deformities Extremities:  No CCE  Neurologic:  Oriented x 3.  No limb weakness.  No tremor.  Alert. Good historian.  Skin:  No jaundice   Psych:  Pleasant, cooperative.   LAB RESULTS:  Recent Labs  03/31/16 0002  WBC 15.1*  HGB 11.7*  HCT 31.6*  PLT 298   BMET Lab Results  Component Value Date   NA 131 (L) 03/31/2016   NA 126 (L) 03/31/2016   NA 132 (L) 03/26/2016   K 3.8 03/31/2016   K 3.5 03/31/2016   K 3.6 03/26/2016   CL 96 (L) 03/31/2016   CL 92 (L) 03/31/2016   CL 94 (L) 03/26/2016   CO2 25 03/31/2016   CO2 23 03/31/2016   CO2 27 03/26/2016   GLUCOSE 131 (H) 03/31/2016   GLUCOSE 118 (H) 03/31/2016   GLUCOSE 113 (H) 03/26/2016   BUN 12 03/31/2016   BUN 11 03/31/2016   BUN 10 03/26/2016   CREATININE 1.57 (H) 03/31/2016   CREATININE 1.50 (H) 03/31/2016   CREATININE 1.20 03/26/2016   CALCIUM 8.0 (L) 03/31/2016   CALCIUM 8.4 (L) 03/31/2016   CALCIUM 9.3 03/26/2016   LFT  Recent Labs  03/31/16 0002  PROT 7.8  ALBUMIN 3.9  AST 145*  ALT 181*  ALKPHOS 208*  BILITOT 1.9*   PT/INR Lab Results  Component Value Date   INR 1.08 03/31/2016   Lipase     Component Value Date/Time   LIPASE 34 03/31/2016 0002    RADIOLOGY STUDIES: Dg Chest 2 View  Result Date: 03/31/2016 CLINICAL DATA:  Acute onset of fever and lower back pain. Initial encounter. EXAM: CHEST  2 VIEW COMPARISON:  None. FINDINGS: The lungs are well-aerated. Left midlung opacity raises concern for pneumonia. There is no evidence of pleural effusion or pneumothorax. The heart is normal in size; the mediastinal contour is within normal limits. No acute osseous abnormalities are seen. IMPRESSION: Left midlung opacity raises concern for pneumonia. Electronically Signed   By: Roanna Raider M.D.   On: 03/31/2016 03:33   US Abdomen Limited  Result Date: 03/31/2016 CLINICAL DATA:  Increased liver function studies. Fever. Previous cholecystectomy. EXAM: US ABDOMEN LIMITED - RIGHT UPPER QUADRANT  COMPARISON:  CT abdomen and pelvis 03/27/2016 FINDINGS: Gallbladder: The gallbladder is surgically absent. Common bile duct: Diameter: 4.3 mm, normal Liver: No focal lesion identified. Within normal limits in parenchymal echogenicity. IMPRESSION: Surgical absence of the gallbladder. No bile duct dilatation. Normal homogeneous liver parenchymal echotexture. Electronically Signed   By: Burman Nieves M.D.   On: 03/31/2016 02:51    ENDOSCOPIC STUDIES: ERCP Dr. Elnoria Howard   IMPRESSION:   *  Fever, n/v, malaise, elevated LFTs.  Suspect viral hepatitis.  Had not condumed enough acetaminophen to cause liver damage.   *  ? CAP.  Left sided pneumonia per CT but clinically no respiratory sxs.    *  Microcytosis. No hx anemia though Hgb currently lower post hydration.   *  AKI.  Likely due to dehydration.     PLAN:     *  Add EBV to labs.  Await pending viral studies.  Cancel MRCP.  Discontinue Cipro (d/w Dr Butler Denmark).   I upped the NS to 150 per hour.    Jennye Moccasin  03/31/2016, 8:13 AM Pager: [phone]  ________________________________________________________________________  [facility] MD note:  I personally examined the patient, reviewed the data and agree with the assessment and plan described above.  More impressed with viral possibility here (vs recurrent biliary stone disease).  Awaiting acute viral hep panel, ANA, EBV, HIV testing.  Encouraged him to  eat and should start scheduled antiemetics if his PO intake is limited by nausea.    Dr. Nicholes Mango will assume GI care in the morning.   Rob Bunting, MD [facility] Pager [phone]

## 2016-03-31 NOTE — H&P (Signed)
History and Physical    Jason Villarreal VWU:[id number] DOB: Aug 04, 1990 DOA: 03/30/2016  Referring MD/NP/PA:   PCP: No PCP Per Patient   Patient coming from:  The patient is coming from home.  At baseline, pt is independent for most of ADL.   Chief Complaint: Fever, nausea, vomiting, diarrhea, back pain  HPI: Jason Villarreal is a 25 y.o. male with medical history significant of  Cholecystitis, s/p of cholecystectomy last year, who presents with fever, nausea, vomiting, diarrhea and back pain.  Patient has been having nausea, vomiting, diarrhea, fever for more than 2 weeks. His vomiting has resolved today, but continues to have nausea and diarrhea. He has at least 3 bowel movements with loose stool each day. No abdominal pain. He has mild fever, but denies cough, chest pain, SOB, running nose, dysuria or burning on urination. Patient states that he drinks a lot of water, at least 10 bottles each day, therefore has increased urinary frequency.  Patient was seen in ED on 03/26/16 ED. He had negative CT abdomen/pelvis, but was found to have abnormal liver function. He was given referral to GI, but not seen yet. He states that he was given prescription of ciprofloxacin by GI, Dr. Christella HartiganJacobs two days ago. Per Dr. Christella HartiganJacobs telephone notes, concerning bout possible retained CBD stones. He states that he has been taking antibiotics, but symptoms have no improvement.   Patient states that he started having back pain since last night. No injury. His back pain is located in the left lower back in paraspinal muscle area. It is constant, 6 out of 10 in severity, nonradiating. No leg numbness or weakness.  ED Course: pt was found to have WBC 15.1, negative urinalysis (RBC 0-5), lactate 1.26, sodium 126, AKi with creatinine 1.50, temperature 100.6, no tachycardia, no tachypnea, CXR showed left mid ung opacity. US-abd showed surgical absence of the gallbladder. No bile duct dilatation. Normal homogeneous liver  parenchymal echotexture. Pt is placed on med-surg bed for obs.  Review of Systems:   General: has fevers, chills, no changes in body weight, has poor appetite, has fatigue HEENT: no blurry vision, hearing changes or sore throat Respiratory: no dyspnea, coughing, wheezing CV: no chest pain, no palpitations GI: has nausea, vomiting, abdominal pain, diarrhea,  GU: no dysuria, burning on urination, increased urinary frequency, hematuria  Ext: no leg edema Neuro: no unilateral weakness, numbness, or tingling, no vision change or hearing loss Skin: no rash, no skin tear. MSK: No muscle spasm, no deformity, no limitation of range of movement in spin Heme: No easy bruising.  Travel history: No recent long distant travel.  Allergy:  Allergies  Allergen Reactions  . Penicillins     Per mom since childhood Has patient had a PCN reaction causing immediate rash, facial/tongue/throat swelling, SOB or lightheadedness with hypotension: No Has patient had a PCN reaction causing severe rash involving mucus membranes or skin necrosis: No Has patient had a PCN reaction that required hospitalization No Has patient had a PCN reaction occurring within the last 10 years: No If all of the above answers are "NO", then may proceed with Cephalosporin use.     Past Medical History:  Diagnosis Date  . Cholecystitis     Past Surgical History:  Procedure Laterality Date  . CHOLECYSTECTOMY N/A 04/16/2015   Procedure: LAPAROSCOPIC CHOLECYSTECTOMY ;  Surgeon: Ovidio Kinavid Newman, MD;  Location: [facility];  Service: General;  Laterality: N/A;    Social History:  reports that he has never smoked. He  has never used smokeless tobacco. He reports that he does not drink alcohol. His drug history is not on file.  Family History:  Family History  Problem Relation Age of Onset  . Diabetes Father      Prior to Admission medications   Medication Sig Start Date End Date Taking? Authorizing Provider  acetaminophen (TYLENOL)  500 MG tablet Take 500 mg by mouth every 6 (six) hours as needed for mild pain.   Yes Historical Provider, MD  ciprofloxacin (CIPRO) 500 MG tablet Take 1 tablet (500 mg total) by mouth 2 (two) times daily. 03/29/16  Yes Rachael Fee, MD  omeprazole (PRILOSEC) 20 MG capsule Take 1 capsule (20 mg total) by mouth daily. 03/27/16  Yes April Palumbo, MD  ondansetron [facility] ODT) 8 MG disintegrating tablet 8mg  ODT q8 hours prn nausea 03/27/16  Yes April Palumbo, MD  dicyclomine (BENTYL) 10 MG capsule Take 1 pill before meals and at bedtime only if needed for abdominal pain. Patient not taking: Reported on 03/27/2016 04/13/15   Peyton Najjar, MD    Physical Exam: Vitals:   03/31/16 0122 03/31/16 0413 03/31/16 0442 03/31/16 0450  BP: 113/63 119/80 121/90   Pulse: 77 72 78   Resp: 19 18 18    Temp: 98.7 F (37.1 C) 98.2 F (36.8 C) 98.2 F (36.8 C)   TempSrc: Oral Oral Oral   SpO2: 99% 98% 100%   Weight:    103.2 kg (227 lb 8.2 oz)  Height:   5\' 9"  (1.753 m)    General: Not in acute distress HEENT:       Eyes: PERRL, EOMI, no scleral icterus.       ENT: No discharge from the ears and nose, no pharynx injection, no tonsillar enlargement.        Neck: No JVD, no bruit, no mass felt. Heme: No neck lymph node enlargement. Cardiac: S1/S2, RRR, No murmurs, No gallops or rubs. Respiratory: No rales, wheezing, rhonchi or rubs. GI: Soft, nondistended, nontender, no rebound pain, no organomegaly, BS present. GU: No hematuria Ext: No pitting leg edema bilaterally. 2+DP/PT pulse bilaterally. Musculoskeletal: has tenderness over left lower back in paraspinal muscle area Skin: No rashes.  Neuro: Alert, oriented X3, cranial nerves II-XII grossly intact, moves all extremities normally. Psych: Patient is not psychotic, no suicidal or hemocidal ideation.  Labs on Admission: I have personally reviewed following labs and imaging studies  CBC:  Recent Labs Lab 03/26/16 2358 03/31/16 0002  WBC 8.8  15.1*  NEUTROABS  --  12.7*  HGB 14.6 11.7*  HCT 40.4 31.6*  MCV 74.3* 73.1*  PLT 272 298   Basic Metabolic Panel:  Recent Labs Lab 03/26/16 2358 03/31/16 0002  NA 132* 126*  K 3.6 3.5  CL 94* 92*  CO2 27 23  GLUCOSE 113* 118*  BUN 10 11  CREATININE 1.20 1.50*  CALCIUM 9.3 8.4*   GFR: Estimated Creatinine Clearance: 88.4 mL/min (by C-G formula based on SCr of 1.5 mg/dL (H)). Liver Function Tests:  Recent Labs Lab 03/26/16 2358 03/31/16 0002  AST 189* 145*  ALT 214* 181*  ALKPHOS 194* 208*  BILITOT 1.9* 1.9*  PROT 9.3* 7.8  ALBUMIN 4.8 3.9    Recent Labs Lab 03/26/16 2358 03/31/16 0002  LIPASE 23 34   No results for input(s): AMMONIA in the last 168 hours. Coagulation Profile: No results for input(s): INR, PROTIME in the last 168 hours. Cardiac Enzymes: No results for input(s): CKTOTAL, CKMB, CKMBINDEX, TROPONINI in the  last 168 hours. BNP (last 3 results) No results for input(s): PROBNP in the last 8760 hours. HbA1C: No results for input(s): HGBA1C in the last 72 hours. CBG: No results for input(s): GLUCAP in the last 168 hours. Lipid Profile: No results for input(s): CHOL, HDL, LDLCALC, TRIG, CHOLHDL, LDLDIRECT in the last 72 hours. Thyroid Function Tests: No results for input(s): TSH, T4TOTAL, FREET4, T3FREE, THYROIDAB in the last 72 hours. Anemia Panel: No results for input(s): VITAMINB12, FOLATE, FERRITIN, TIBC, IRON, RETICCTPCT in the last 72 hours. Urine analysis:    Component Value Date/Time   COLORURINE YELLOW 03/31/2016 0110   APPEARANCEUR CLEAR 03/31/2016 0110   LABSPEC 1.006 03/31/2016 0110   PHURINE 6.0 03/31/2016 0110   GLUCOSEU NEGATIVE 03/31/2016 0110   HGBUR MODERATE (A) 03/31/2016 0110   BILIRUBINUR NEGATIVE 03/31/2016 0110   BILIRUBINUR moderate (A) 04/13/2015 0838   KETONESUR NEGATIVE 03/31/2016 0110   PROTEINUR 30 (A) 03/31/2016 0110   UROBILINOGEN 0.2 04/14/2015 1929   NITRITE NEGATIVE 03/31/2016 0110   LEUKOCYTESUR  NEGATIVE 03/31/2016 0110   Sepsis Labs: @LABRCNTIP (procalcitonin:4,lacticidven:4) )No results found for this or any previous visit (from the past 240 hour(s)).   Radiological Exams on Admission: Dg Chest 2 View  Result Date: 03/31/2016 CLINICAL DATA:  Acute onset of fever and lower back pain. Initial encounter. EXAM: CHEST  2 VIEW COMPARISON:  None. FINDINGS: The lungs are well-aerated. Left midlung opacity raises concern for pneumonia. There is no evidence of pleural effusion or pneumothorax. The heart is normal in size; the mediastinal contour is within normal limits. No acute osseous abnormalities are seen. IMPRESSION: Left midlung opacity raises concern for pneumonia. Electronically Signed   By: Roanna Raider M.D.   On: 03/31/2016 03:33   US Abdomen Limited  Result Date: 03/31/2016 CLINICAL DATA:  Increased liver function studies. Fever. Previous cholecystectomy. EXAM: US ABDOMEN LIMITED - RIGHT UPPER QUADRANT COMPARISON:  CT abdomen and pelvis 03/27/2016 FINDINGS: Gallbladder: The gallbladder is surgically absent. Common bile duct: Diameter: 4.3 mm, normal Liver: No focal lesion identified. Within normal limits in parenchymal echogenicity. IMPRESSION: Surgical absence of the gallbladder. No bile duct dilatation. Normal homogeneous liver parenchymal echotexture. Electronically Signed   By: Burman Nieves M.D.   On: 03/31/2016 02:51     EKG: Not done in ED, will get one.   Assessment/Plan Principal Problem:   Nausea vomiting and diarrhea Active Problems:   Fever   Back pain   Hyponatremia   AKI (acute kidney injury) (HCC)   Nausea vomiting and diarrhea and abnormal liver function: Etiology is not clear. Differential diagnosis include hepatitis, HIV, gastroenteritis, C. difficile colitis, retained CBD stone (as concerned by GI, Dr. Christella Hartigan).  -will place on med-surg bed for obs -IV aztreonam -Check C. difficile PCR, GI pathogen panel, hepatitis panel, HIV antibody -IV  Pepcid -Place call GI in AM.  Abnormal chest x-ray: Chest x-ray showed left mid lung opacity concerning for PNA, but pt patient denies cough, chest pain, shortness of breath, runny nose. Clinically patient does not seem to have pneumonia. -will hold off Abx for PAN now -will check strep and legionella antigen of urine. If positive antigen or develops respiratory symptoms, will start antibiotics to cover PAN.  Hyponatremia: Most likely due to combination of GI loss and excessive purer water intake. -will check urine sodium, urine osmolality, serum osmolality, TSH -IVF: 2l NS, then 75 cc/h -BMP q6h -Fluid restriction  AKI: Likely due to prerenal secondary to dehydration. No hydronephrosis by abdominal ultrasound - IVF as  above - Check FeNa - Follow up renal function by BMP  Back pain: Seems to due be paraspinal muscle straining. -When necessary oxycodone (patient cannot take Tylenol due to abnormal liver function, cannot take ibuprofen due to AKi) -prn robaxin    DVT ppx: SQ Lovenox Code Status: Full code Family Communication: Yes, patient's mother and grandmother at bed side Disposition Plan:  Anticipate discharge back to previous home environment Consults called:  none Admission status: medical floor/obs   Date of Service 03/31/2016    Lorretta Harp [facility] Pager [phone]  If 7PM-7AM, please contact night-coverage [email or web address] Password [facility] 03/31/2016, 6:35 AM

## 2016-04-01 ENCOUNTER — Observation Stay (HOSPITAL_COMMUNITY): Payer: 59

## 2016-04-01 DIAGNOSIS — R945 Abnormal results of liver function studies: Secondary | ICD-10-CM | POA: Diagnosis not present

## 2016-04-01 DIAGNOSIS — N170 Acute kidney failure with tubular necrosis: Secondary | ICD-10-CM | POA: Diagnosis present

## 2016-04-01 DIAGNOSIS — H53149 Visual discomfort, unspecified: Secondary | ICD-10-CM | POA: Diagnosis present

## 2016-04-01 DIAGNOSIS — Z880 Allergy status to penicillin: Secondary | ICD-10-CM | POA: Diagnosis not present

## 2016-04-01 DIAGNOSIS — R112 Nausea with vomiting, unspecified: Secondary | ICD-10-CM | POA: Diagnosis present

## 2016-04-01 DIAGNOSIS — E669 Obesity, unspecified: Secondary | ICD-10-CM | POA: Diagnosis present

## 2016-04-01 DIAGNOSIS — E871 Hypo-osmolality and hyponatremia: Secondary | ICD-10-CM | POA: Diagnosis present

## 2016-04-01 DIAGNOSIS — H578 Other specified disorders of eye and adnexa: Secondary | ICD-10-CM | POA: Diagnosis present

## 2016-04-01 DIAGNOSIS — R7989 Other specified abnormal findings of blood chemistry: Secondary | ICD-10-CM | POA: Diagnosis present

## 2016-04-01 DIAGNOSIS — R718 Other abnormality of red blood cells: Secondary | ICD-10-CM | POA: Diagnosis present

## 2016-04-01 DIAGNOSIS — Z833 Family history of diabetes mellitus: Secondary | ICD-10-CM | POA: Diagnosis not present

## 2016-04-01 DIAGNOSIS — Z6833 Body mass index (BMI) 33.0-33.9, adult: Secondary | ICD-10-CM | POA: Diagnosis not present

## 2016-04-01 DIAGNOSIS — R197 Diarrhea, unspecified: Secondary | ICD-10-CM | POA: Diagnosis present

## 2016-04-01 DIAGNOSIS — Z9049 Acquired absence of other specified parts of digestive tract: Secondary | ICD-10-CM | POA: Diagnosis not present

## 2016-04-01 DIAGNOSIS — M545 Low back pain: Secondary | ICD-10-CM | POA: Diagnosis present

## 2016-04-01 DIAGNOSIS — N179 Acute kidney failure, unspecified: Secondary | ICD-10-CM | POA: Diagnosis not present

## 2016-04-01 DIAGNOSIS — E860 Dehydration: Secondary | ICD-10-CM | POA: Diagnosis present

## 2016-04-01 DIAGNOSIS — R740 Nonspecific elevation of levels of transaminase and lactic acid dehydrogenase [LDH]: Secondary | ICD-10-CM | POA: Diagnosis present

## 2016-04-01 DIAGNOSIS — E8779 Other fluid overload: Secondary | ICD-10-CM | POA: Diagnosis present

## 2016-04-01 DIAGNOSIS — D509 Iron deficiency anemia, unspecified: Secondary | ICD-10-CM | POA: Diagnosis present

## 2016-04-01 DIAGNOSIS — R6510 Systemic inflammatory response syndrome (SIRS) of non-infectious origin without acute organ dysfunction: Secondary | ICD-10-CM | POA: Diagnosis present

## 2016-04-01 DIAGNOSIS — R509 Fever, unspecified: Secondary | ICD-10-CM | POA: Diagnosis present

## 2016-04-01 DIAGNOSIS — E872 Acidosis: Secondary | ICD-10-CM | POA: Diagnosis present

## 2016-04-01 DIAGNOSIS — R739 Hyperglycemia, unspecified: Secondary | ICD-10-CM | POA: Diagnosis present

## 2016-04-01 DIAGNOSIS — D72829 Elevated white blood cell count, unspecified: Secondary | ICD-10-CM | POA: Diagnosis not present

## 2016-04-01 LAB — VITAMIN B12: Vitamin B-12: 825 pg/mL (ref 180–914)

## 2016-04-01 LAB — COMPREHENSIVE METABOLIC PANEL
ALT: 123 U/L — AB (ref 17–63)
AST: 68 U/L — ABNORMAL HIGH (ref 15–41)
Albumin: 3.1 g/dL — ABNORMAL LOW (ref 3.5–5.0)
Alkaline Phosphatase: 208 U/L — ABNORMAL HIGH (ref 38–126)
Anion gap: 8 (ref 5–15)
BUN: 18 mg/dL (ref 6–20)
CHLORIDE: 103 mmol/L (ref 101–111)
CO2: 23 mmol/L (ref 22–32)
CREATININE: 2.28 mg/dL — AB (ref 0.61–1.24)
Calcium: 7.5 mg/dL — ABNORMAL LOW (ref 8.9–10.3)
GFR calc non Af Amer: 38 mL/min — ABNORMAL LOW (ref >60)
GFR, EST AFRICAN AMERICAN: 44 mL/min — AB (ref >60)
Glucose, Bld: 133 mg/dL — ABNORMAL HIGH (ref 65–99)
Potassium: 3.6 mmol/L (ref 3.5–5.1)
SODIUM: 134 mmol/L — AB (ref 135–145)
Total Bilirubin: 1.1 mg/dL (ref 0.3–1.2)
Total Protein: 6.7 g/dL (ref 6.5–8.1)

## 2016-04-01 LAB — RETICULOCYTES
RBC.: 4.01 MIL/uL — ABNORMAL LOW (ref 4.22–5.81)
RETIC CT PCT: 0.7 % (ref 0.4–3.1)
Retic Count, Absolute: 28.1 10*3/uL (ref 19.0–186.0)

## 2016-04-01 LAB — FERRITIN: Ferritin: 742 ng/mL — ABNORMAL HIGH (ref 24–336)

## 2016-04-01 LAB — SODIUM, URINE, RANDOM

## 2016-04-01 LAB — CBC
HCT: 29.8 % — ABNORMAL LOW (ref 39.0–52.0)
Hemoglobin: 10.9 g/dL — ABNORMAL LOW (ref 13.0–17.0)
MCH: 27.2 pg (ref 26.0–34.0)
MCHC: 36.6 g/dL — AB (ref 30.0–36.0)
MCV: 74.3 fL — AB (ref 78.0–100.0)
PLATELETS: 313 10*3/uL (ref 150–400)
RBC: 4.01 MIL/uL — ABNORMAL LOW (ref 4.22–5.81)
RDW: 13.8 % (ref 11.5–15.5)
WBC: 16.5 10*3/uL — ABNORMAL HIGH (ref 4.0–10.5)

## 2016-04-01 LAB — LACTIC ACID, PLASMA
Lactic Acid, Venous: 1.3 mmol/L (ref 0.5–1.9)
Lactic Acid, Venous: 1.5 mmol/L (ref 0.5–1.9)

## 2016-04-01 LAB — URINE MICROSCOPIC-ADD ON

## 2016-04-01 LAB — EPSTEIN-BARR VIRUS VCA, IGM

## 2016-04-01 LAB — URINALYSIS, ROUTINE W REFLEX MICROSCOPIC
Bilirubin Urine: NEGATIVE
Glucose, UA: NEGATIVE mg/dL
Ketones, ur: NEGATIVE mg/dL
LEUKOCYTES UA: NEGATIVE
NITRITE: NEGATIVE
Protein, ur: NEGATIVE mg/dL
SPECIFIC GRAVITY, URINE: 1.005 (ref 1.005–1.030)
pH: 6 (ref 5.0–8.0)

## 2016-04-01 LAB — IRON AND TIBC
IRON: 27 ug/dL — AB (ref 45–182)
SATURATION RATIOS: 11 % — AB (ref 17.9–39.5)
TIBC: 251 ug/dL (ref 250–450)
UIBC: 224 ug/dL

## 2016-04-01 LAB — SAVE SMEAR

## 2016-04-01 LAB — OCCULT BLOOD X 1 CARD TO LAB, STOOL
FECAL OCCULT BLD: NEGATIVE
Fecal Occult Bld: POSITIVE — AB

## 2016-04-01 LAB — LEGIONELLA PNEUMOPHILA SEROGP 1 UR AG: L. PNEUMOPHILA SEROGP 1 UR AG: NEGATIVE

## 2016-04-01 LAB — FOLATE: Folate: 7.9 ng/mL (ref >5.9)

## 2016-04-01 LAB — GLUCOSE, CAPILLARY: Glucose-Capillary: 107 mg/dL — ABNORMAL HIGH (ref 65–99)

## 2016-04-01 LAB — OSMOLALITY, URINE: OSMOLALITY UR: 131 mosm/kg — AB (ref 300–900)

## 2016-04-01 LAB — CREATININE, URINE, RANDOM: Creatinine, Urine: 49.87 mg/dL

## 2016-04-01 MED ORDER — SODIUM CHLORIDE 0.9 % IV BOLUS (SEPSIS)
1000.0000 mL | Freq: Once | INTRAVENOUS | Status: AC
  Administered 2016-04-01: 1000 mL via INTRAVENOUS

## 2016-04-01 MED ORDER — ACETAMINOPHEN 325 MG PO TABS
650.0000 mg | ORAL_TABLET | Freq: Four times a day (QID) | ORAL | Status: DC | PRN
  Administered 2016-04-01: 650 mg via ORAL
  Filled 2016-04-01 (×2): qty 2

## 2016-04-01 MED ORDER — SODIUM CHLORIDE 0.9 % IV SOLN
510.0000 mg | Freq: Once | INTRAVENOUS | Status: AC
  Administered 2016-04-02: 510 mg via INTRAVENOUS
  Filled 2016-04-01: qty 17

## 2016-04-01 MED ORDER — FOLIC ACID 1 MG PO TABS
2.0000 mg | ORAL_TABLET | Freq: Every day | ORAL | Status: DC
  Administered 2016-04-01 – 2016-04-04 (×4): 2 mg via ORAL
  Filled 2016-04-01 (×4): qty 2

## 2016-04-01 MED ORDER — SODIUM CHLORIDE 0.9 % IV BOLUS (SEPSIS)
500.0000 mL | Freq: Once | INTRAVENOUS | Status: AC
  Administered 2016-04-01: 500 mL via INTRAVENOUS

## 2016-04-01 MED ORDER — METHYLPREDNISOLONE SODIUM SUCC 125 MG IJ SOLR
80.0000 mg | Freq: Once | INTRAMUSCULAR | Status: AC
  Administered 2016-04-02: 80 mg via INTRAVENOUS
  Filled 2016-04-01: qty 2

## 2016-04-01 NOTE — Progress Notes (Signed)
Patient have c/o chilling and shaking, "I feel a little feverish."  VSS afebrile, see EPIC. Rapid notified for   nurse evaluation.  MD notified. Will continue to monitor and assess patient needs and concerns.

## 2016-04-01 NOTE — Consult Note (Signed)
Referral MD  Reason for Referral: Microcytic anemia   Chief Complaint  Patient presents with  . Fever  . Back Pain  : I have a temperature and may have an infection.  HPI: Jason Villarreal is a real nice 25 her old African-American male. He works for [employer]. He does not smoke. He does not drink.  Of note, is a fact that he had his gallbladder taken out back in October 2016. He had a lot of gallstones in the gallbladder. There's really no family history of cholecystectomies.  As far as he knows, and his mother was with this, there is no history of sickle cell disease.  Back year ago, his hemoglobin was 15. On September 19, his CBC showed a white cell count 8.8. Hemoglobin 14.6 and platelet count 272,000. MCV was 74.  He was admitted on the 24th with possible sepsis. He had a temperature of 102.2.  When he came in, his white count 15.1. Hemoglobin 11.7. Hematocrit 31.6. Platelet count 298,000. MCV was 73. There was a comment that there were spherocytes.  His reticulocyte count is less than 0.5.  Get iron studies. His ferritin was 742. His saturation was only 11%. His serum iron was 27. His folate was in the low normal range of 7.9. His HIV was negative. His EBV titer was also negative.  He has elevated liver function tests. His SGPT is 181 SGOT is 145. These were quite elevated a year go when he had his gallbladder taken out. His bilirubin has been normal. Alkaline phosphatase is up a little bit. His creatinine is up a little bit.  His gastroneurologist is worried that he may have some retained bile duct stones. Get a CT scan of the abdomen and pelvis on September 20. This was unremarkable.  He had a renal ultrasound done today. This was grossly unremarkable.  He has had blood cultures done. These are negative to date.  As far as mother knows, there is no family history of any blood problem. There is no history of sickle cell in the family. Is no obvious thalassemia.  Overall,  he's been pretty healthy. This was first on his been in the hospital outside of when he had his gallbladder taken out.  The pathology on the gallbladder was unremarkable.  He is not a vegetarian. His appetite has been pretty good. He's had some nausea and vomiting. He's had some diarrhea. Stool studies have been negative for C. difficile.  He's not been around anybody's been sick.  He has no obvious wheeze factors for HIV or hepatitis. He has no tattoos.  He has no children.   Past Medical History:  Diagnosis Date  . Cholecystitis   :  Past Surgical History:  Procedure Laterality Date  . CHOLECYSTECTOMY N/A 04/16/2015   Procedure: LAPAROSCOPIC CHOLECYSTECTOMY ;  Surgeon: Alphonsa Overall, MD;  Location: [facility];  Service: General;  Laterality: N/A;  :   Current Facility-Administered Medications:  .  0.9 %  sodium chloride infusion, , Intravenous, Continuous, Debbe Odea, MD, Last Rate: 125 mL/hr at 04/01/16 1445 .  acetaminophen (TYLENOL) tablet 650 mg, 650 mg, Oral, Q6H PRN, Debbe Odea, MD .  diclofenac sodium (VOLTAREN) 1 % transdermal gel 2 g, 2 g, Topical, QID, Debbe Odea, MD, 2 g at 04/01/16 1352 .  enoxaparin (LOVENOX) injection 40 mg, 40 mg, Subcutaneous, Q24H, Ivor Costa, MD .  famotidine (PEPCID) IVPB 20 mg premix, 20 mg, Intravenous, Q12H, Ivor Costa, MD, 20 mg at 04/01/16 1113 .  folic acid (FOLVITE) tablet 2 mg, 2 mg, Oral, Daily, Volanda Napoleon, MD, 2 mg at 04/01/16 1351 .  methocarbamol (ROBAXIN) 500 mg in dextrose 5 % 50 mL IVPB, 500 mg, Intravenous, Q8H PRN, Ivor Costa, MD .  ondansetron [facility]) injection 4 mg, 4 mg, Intravenous, Q8H PRN, Ivor Costa, MD, 4 mg at 03/31/16 0740 .  oxyCODONE (Oxy IR/ROXICODONE) immediate release tablet 5 mg, 5 mg, Oral, Q6H PRN, Debbe Odea, MD, 5 mg at 04/01/16 1505:  . diclofenac sodium  2 g Topical QID  . enoxaparin (LOVENOX) injection  40 mg Subcutaneous Q24H  . famotidine (PEPCID) IV  20 mg Intravenous Q12H  . folic acid  2 mg  Oral Daily  :  Allergies  Allergen Reactions  . Penicillins     Per mom since childhood Has patient had a PCN reaction causing immediate rash, facial/tongue/throat swelling, SOB or lightheadedness with hypotension: No Has patient had a PCN reaction causing severe rash involving mucus membranes or skin necrosis: No Has patient had a PCN reaction that required hospitalization No Has patient had a PCN reaction occurring within the last 10 years: No If all of the above answers are "NO", then may proceed with Cephalosporin use.   :  Family History  Problem Relation Age of Onset  . Diabetes Father   :  Social History   Social History  . Marital status: Single    Spouse name: N/A  . Number of children: N/A  . Years of education: N/A   Occupational History  . Not on file.   Social History Main Topics  . Smoking status: Never Smoker  . Smokeless tobacco: Never Used  . Alcohol use No  . Drug use: Unknown  . Sexual activity: Not on file   Other Topics Concern  . Not on file   Social History Narrative   Senior at [employer]  :  Pertinent items are noted in HPI.  Exam: Patient Vitals for the past 24 hrs:  BP Temp Temp src Pulse Resp SpO2  04/01/16 1415 (!) 147/76 - - 92 18 100 %  04/01/16 0608 109/73 98.7 F (37.1 C) Oral 76 20 96 %  04/01/16 0330 122/76 (!) 100.6 F (38.1 C) Oral (!) 106 18 98 %  04/01/16 0044 108/84 99.1 F (37.3 C) Oral 88 (!) 24 98 %  03/31/16 2100 111/69 97.8 F (36.6 C) Oral 74 18 97 %    Somewhat obese African [place] male in no obvious distress. He is alert and oriented 3. Head and neck exam shows no scleral icterus. He has no adenopathy in the neck. There is no intraoral lesions. Lungs are clear. Cardiac exam regular rate and rhythm with no murmurs, rubs or bruits. Abdomen is soft. He has good bowel sounds. He has no fluid wave. There is no palpable splenomegaly. Back exam shows no tenderness over the spine, ribs or hips. Externa shows no clubbing,  cyanosis or edema. Has good range of motion of his joints. Skin exam shows no rashes, ecchymoses or petechia. Neurological exam shows no focal neurological deficits.    Recent Labs  03/31/16 0905 04/01/16 0109  WBC 16.4* 16.5*  HGB 11.4* 10.9*  HCT 30.8* 29.8*  PLT 313 313    Recent Labs  03/31/16 0621 04/01/16 0109  NA 131* 134*  K 3.8 3.6  CL 96* 103  CO2 25 23  GLUCOSE 131* 133*  BUN 12 18  CREATININE 1.57* 2.28*  CALCIUM 8.0* 7.5*    Blood  smear review:  Slight anisocytosis and poikilocytosis. He has some microcytic red blood cells. I see no teardrop cells. He has no nucleated red blood cells. I see no target cells. There is no rouleau formation. There may be a few spherocytes. I see no schistocytes. White blood cells are normal in morphology mattress. He has an increased number of mature polys. I do not see any hypersegmented polys. I do not see any immature myeloid or lymphoid forms. There are no blasts. Platelets are adequate in number and size. Platelets are well granulated.    Pathology: None     Assessment and Plan:  Mr. Kearse is a 25 year old African-American male. He has mild anemia. I would had to think that this is most likely iron deficiency. I think that the ferritin is elevated because of it being an acute phase reactant.  His stool needs to be checked for blood.  I think a dose of IV iron would be appropriate for him.  With his lower MCV, he may have thalassemia. I did not see any target cells. I don't think he has sickle cell trait.  I cannot remember ever seeing an African-American with hereditary spherocytosis. This really is a disease of Northern European dissent. There is no obvious family history of anemia with gallbladder removed.  I think that folic acid would not be about idea for him.  I do not see any obvious hematologic malignancy that we had to worry about. I do not see a need for a bone marrow biopsy.  I think we should see how the IV  iron dose for him. We may not see a response for a least a week or so. However, I think given this to him would not be a bad idea.  I spent about 45 minutes with he and his family. They are all very nice.  He clearly is not hemolyzing given his reticulocyte count of 0.5%. I suppose that one could postulate that he is hemolyzing but that he just cannot not a response in the bone marrow because of lack of iron.  I appreciate the opportunity to have seen him. He is very nice.   Jason Haw, MD  [place] 14:14

## 2016-04-01 NOTE — Progress Notes (Signed)
Subjective: Patient seems to be doing better today. He denies having any abdominal pain. He had 1 BM today; 2 BM's yesterday. Patient denies having any melena or hematochezia.  CXR shows possible pneumonia. Worsening creatinine with low iron and iron saturation. He denies having any sick contacts. No recent travel outside the [place].   Objective: Vital signs in last 24 hours: Temp:  [97.8 F (36.6 C)-100.6 F (38.1 C)] 98.7 F (37.1 C) (09/25 0608) Pulse Rate:  [74-106] 92 (09/25 1415) Resp:  [18-24] 18 (09/25 1415) BP: (108-147)/(69-84) 147/76 (09/25 1415) SpO2:  [96 %-100 %] 100 % (09/25 1415) Last BM Date: 03/31/16  Intake/Output from previous day: 09/24 0701 - 09/25 0700 In: 3325.8 [P.O.:1065; I.V.:2160.8; IV Piggyback:100] Out: -  Intake/Output this shift: Total I/O In: 1421.3 [P.O.:240; I.V.:1131.3; IV Piggyback:50] Out: 700 [Urine:700]  General appearance: alert, cooperative, no distress and moderately obese Resp: clear to auscultation bilaterally Cardio: regular rate and rhythm, S1, S2 normal, no murmur, click, rub or gallop GI: soft, non-tender; bowel sounds normal; no masses,  no organomegaly Extremities: extremities normal, atraumatic, no cyanosis or edema  Lab Results:  Recent Labs  03/31/16 0002 03/31/16 0905 04/01/16 0109  WBC 15.1* 16.4* 16.5*  HGB 11.7* 11.4* 10.9*  HCT 31.6* 30.8* 29.8*  PLT 298 313 313   BMET  Recent Labs  03/31/16 0002 03/31/16 0621 04/01/16 0109  NA 126* 131* 134*  K 3.5 3.8 3.6  CL 92* 96* 103  CO2 23 25 23   GLUCOSE 118* 131* 133*  BUN 11 12 18   CREATININE 1.50* 1.57* 2.28*  CALCIUM 8.4* 8.0* 7.5*   LFT  Recent Labs  04/01/16 0109  PROT 6.7  ALBUMIN 3.1*  AST 68*  ALT 123*  ALKPHOS 208*  BILITOT 1.1   PT/INR  Recent Labs  03/31/16 0621  LABPROT 14.0  INR 1.08   Hepatitis Panel No results for input(s): HEPBSAG, HCVAB, HEPAIGM, HEPBIGM in the last 72 hours. C-Diff  Recent Labs  03/31/16 0347   CDIFFTOX NEGATIVE   No results for input(s): CDIFFPCR in the last 72 hours. Fecal Lactopherrin No results for input(s): FECLLACTOFRN in the last 72 hours.  Studies/Results: Dg Chest 2 View  Result Date: 03/31/2016 CLINICAL DATA:  Acute onset of fever and lower back pain. Initial encounter. EXAM: CHEST  2 VIEW COMPARISON:  None. FINDINGS: The lungs are well-aerated. Left midlung opacity raises concern for pneumonia. There is no evidence of pleural effusion or pneumothorax. The heart is normal in size; the mediastinal contour is within normal limits. No acute osseous abnormalities are seen. IMPRESSION: Left midlung opacity raises concern for pneumonia. Electronically Signed   By: Roanna RaiderJeffery  Chang M.D.   On: 03/31/2016 03:33   [place] Renal  Result Date: 04/01/2016 CLINICAL DATA:  Acute kidney injury. EXAM: RENAL / URINARY TRACT ULTRASOUND COMPLETE COMPARISON:  CT scan dated 03/27/2016 new FINDINGS: Right Kidney: Length: 13.8. Echogenicity within normal limits. No mass. Mild pelvocaliectasis. Left Kidney: Length: 12.7 cm. Echogenicity within normal limits. No mass or hydronephrosis visualized. Bladder: Appears normal for degree of bladder distention. IMPRESSION: No significant abnormality. Minimal pelvocaliectasis of the right kidney. Electronically Signed   By: Francene BoyersJames  Maxwell M.D.   On: 04/01/2016 10:38   [place] Abdomen Limited  Result Date: 03/31/2016 CLINICAL DATA:  Increased liver function studies. Fever. Previous cholecystectomy. EXAM: US ABDOMEN LIMITED - RIGHT UPPER QUADRANT COMPARISON:  CT abdomen and pelvis 03/27/2016 FINDINGS: Gallbladder: The gallbladder is surgically absent. Common bile duct: Diameter: 4.3 mm, normal Liver: No  focal lesion identified. Within normal limits in parenchymal echogenicity. IMPRESSION: Surgical absence of the gallbladder. No bile duct dilatation. Normal homogeneous liver parenchymal echotexture. Electronically Signed   By: Burman Nieves M.D.   On: 03/31/2016 02:51     Medications: I have reviewed the patient's current medications.  Assessment/Plan: 1) ?FUO/?SIRS-?Pneumonia on CXR-on Zithromaxwork up in progress. WBC at 16.5K.  2) Abnormal LFT's/s/p cholecystectomy for cholelithiasis-etiology unclear. Will monitor closely. 3) Iron deficiency anemia-patient will receive IV iron tomorrow as per Dr. Gustavo Lah recommendation. Stool sent for FOBT x 1. 4) Worsening renal function inspite of rehydration-needs close monitoring. 5) Back pain-on Robaxin and Oxycodone.   6) Hyperglycemia-check fasting HbA1c.  LOS: 0 days   Macaela Presas 04/01/2016, 6:11 PM

## 2016-04-01 NOTE — Progress Notes (Signed)
PROGRESS NOTE    Jason Villarreal  ZOX:[id number] DOB: 04/16/1991 DOA: 03/30/2016  PCP: No PCP Per Patient   Brief Narrative:  Jason Villarreal is a 25 y.o. male with medical history significant of  Cholecystitis, s/p of cholecystectomy last year, who presents with fever, nausea, vomiting, diarrhea and back pain. Patient was seen in ED on 03/26/16 ED. He had negative CT abdomen/pelvis, but was found to have abnormal liver function. He was given referral to GI, but not seen yet. He states that he was given prescription of ciprofloxacin by GI, Dr. Christella Hartigan two days ago. Per Dr. Christella Hartigan telephone notes, he was concerned bout possible retained CBD stones. He states that he has been taking antibiotics, but symptoms have no improvement.   Subjective: Noted to have chills last night- temp was 100. No further nausea or vomiting. No diarrhea or abdominal pain. Cr noted to increase today- patient states he is not urinating as much as he ought to be.   Assessment & Plan:   Principal Problem: Nausea vomiting and diarrhea/ elevated LFTs/ SIRS with fever, leukocytosis - fever 102 2 days ago- 100.6 lasy night - LFTs improving - ? Viral - GI has evaluated and have ordered viral studies - cont antiemetics, diet as tolerated  Active Problems:    Back pain - sprain- cont pain meds PRN    Hyponatremia/ AKI (acute kidney injury) - dehydration?- it getting worse despite continuous NS infusion - renal ultrasound unrevealing other than mild pelvocaliectasis of right kidney - check Urine studies  -he had a moderate amount of Hb on UA since 9/23- will recheck -  start to monitor I and O carefully  Anemia -  drop in Hb from 14-15 range last year to 11 now - anemia panel shows low Iron saturation but high ferretin (can be due to it being an acute phase reactant) - retic count normal - has spherocytes on his smear- have asked for hematology input     DVT prophylaxis: Lovenox Code Status: Full  Family  Communication: mother Disposition Plan: home when stable Consultants:   GI Procedures:    Antimicrobials:  Anti-infectives    Start     Dose/Rate Route Frequency Ordered Stop   03/31/16 0800  ciprofloxacin (CIPRO) IVPB 400 mg  Status:  Discontinued     400 mg 200 mL/hr over 60 Minutes Intravenous Every 12 hours 03/31/16 0727 03/31/16 0859   03/31/16 0445  cefTRIAXone (ROCEPHIN) 1 g in dextrose 5 % 50 mL IVPB     1 g 100 mL/hr over 30 Minutes Intravenous  Once 03/31/16 0430 03/31/16 0533   03/31/16 0445  azithromycin (ZITHROMAX) 500 mg in dextrose 5 % 250 mL IVPB     500 mg 250 mL/hr over 60 Minutes Intravenous  Once 03/31/16 0430 03/31/16 0630   03/31/16 0400  aztreonam (AZACTAM) 2 g in dextrose 5 % 50 mL IVPB  Status:  Discontinued     2 g 100 mL/hr over 30 Minutes Intravenous STAT 03/31/16 0353 03/31/16 0439       Objective: Vitals:   03/31/16 2100 04/01/16 0044 04/01/16 0330 04/01/16 0608  BP: 111/69 108/84 122/76 109/73  Pulse: 74 88 (!) 106 76  Resp: 18 (!) 24 18 20   Temp: 97.8 F (36.6 C) 99.1 F (37.3 C) (!) 100.6 F (38.1 C) 98.7 F (37.1 C)  TempSrc: Oral Oral Oral Oral  SpO2: 97% 98% 98% 96%  Weight:      Height:  Intake/Output Summary (Last 24 hours) at 04/01/16 1045 Last data filed at 04/01/16 1021  Gross per 24 hour  Intake          3565.83 ml  Output              200 ml  Net          3365.83 ml   Filed Weights   03/31/16 0450  Weight: 103.2 kg (227 lb 8.2 oz)    Examination: General exam: Appears comfortable  HEENT: PERRLA, oral mucosa moist, no sclera icterus or thrush Respiratory system: Clear to auscultation. Respiratory effort normal. Cardiovascular system: S1 & S2 heard, RRR.  No murmurs  Gastrointestinal system: Abdomen soft, non-tender, nondistended. Normal bowel sound. No organomegaly Central nervous system: Alert and oriented. No focal neurological deficits. Extremities: No cyanosis, clubbing or edema Skin: No rashes or  ulcers Psychiatry:  Mood & affect appropriate.     Data Reviewed: I have personally reviewed following labs and imaging studies  CBC:  Recent Labs Lab 03/26/16 2358 03/31/16 0002 03/31/16 0905 04/01/16 0109  WBC 8.8 15.1* 16.4* 16.5*  NEUTROABS  --  12.7*  --   --   HGB 14.6 11.7* 11.4* 10.9*  HCT 40.4 31.6* 30.8* 29.8*  MCV 74.3* 73.1* 73.7* 74.3*  PLT 272 298 313 313   Basic Metabolic Panel:  Recent Labs Lab 03/26/16 2358 03/31/16 0002 03/31/16 0621 04/01/16 0109  NA 132* 126* 131* 134*  K 3.6 3.5 3.8 3.6  CL 94* 92* 96* 103  CO2 27 23 25 23   GLUCOSE 113* 118* 131* 133*  BUN 10 11 12 18   CREATININE 1.20 1.50* 1.57* 2.28*  CALCIUM 9.3 8.4* 8.0* 7.5*   GFR: Estimated Creatinine Clearance: 58.1 mL/min (by C-G formula based on SCr of 2.28 mg/dL (H)). Liver Function Tests:  Recent Labs Lab 03/26/16 2358 03/31/16 0002 04/01/16 0109  AST 189* 145* 68*  ALT 214* 181* 123*  ALKPHOS 194* 208* 208*  BILITOT 1.9* 1.9* 1.1  PROT 9.3* 7.8 6.7  ALBUMIN 4.8 3.9 3.1*    Recent Labs Lab 03/26/16 2358 03/31/16 0002  LIPASE 23 34   No results for input(s): AMMONIA in the last 168 hours. Coagulation Profile:  Recent Labs Lab 03/31/16 0621  INR 1.08   Cardiac Enzymes: No results for input(s): CKTOTAL, CKMB, CKMBINDEX, TROPONINI in the last 168 hours. BNP (last 3 results) No results for input(s): PROBNP in the last 8760 hours. HbA1C: No results for input(s): HGBA1C in the last 72 hours. CBG:  Recent Labs Lab 03/31/16 0756 04/01/16 0805  GLUCAP 99 107*   Lipid Profile: No results for input(s): CHOL, HDL, LDLCALC, TRIG, CHOLHDL, LDLDIRECT in the last 72 hours. Thyroid Function Tests:  Recent Labs  03/31/16 0905  TSH 1.195   Anemia Panel:  Recent Labs  04/01/16 0109  VITAMINB12 825  FOLATE 7.9  FERRITIN 742*  TIBC 251  IRON 27*  RETICCTPCT 0.7   Urine analysis:    Component Value Date/Time   COLORURINE YELLOW 03/31/2016 0110    APPEARANCEUR CLEAR 03/31/2016 0110   LABSPEC 1.006 03/31/2016 0110   PHURINE 6.0 03/31/2016 0110   GLUCOSEU NEGATIVE 03/31/2016 0110   HGBUR MODERATE (A) 03/31/2016 0110   BILIRUBINUR NEGATIVE 03/31/2016 0110   BILIRUBINUR moderate (A) 04/13/2015 0838   KETONESUR NEGATIVE 03/31/2016 0110   PROTEINUR 30 (A) 03/31/2016 0110   UROBILINOGEN 0.2 04/14/2015 1929   NITRITE NEGATIVE 03/31/2016 0110   LEUKOCYTESUR NEGATIVE 03/31/2016 0110   Sepsis Labs: @LABRCNTIP (procalcitonin:4,lacticidven:4) )  Recent Results (from the past 240 hour(s))  C difficile quick scan w PCR reflex     Status: None   Collection Time: 03/31/16  3:47 AM  Result Value Ref Range Status   C Diff antigen NEGATIVE NEGATIVE Final   C Diff toxin NEGATIVE NEGATIVE Final   C Diff interpretation No C. difficile detected.  Final  Gastrointestinal Panel by PCR , Stool     Status: None   Collection Time: 03/31/16  3:47 AM  Result Value Ref Range Status   Campylobacter species NOT DETECTED NOT DETECTED Final   Plesimonas shigelloides NOT DETECTED NOT DETECTED Final   Salmonella species NOT DETECTED NOT DETECTED Final   Yersinia enterocolitica NOT DETECTED NOT DETECTED Final   Vibrio species NOT DETECTED NOT DETECTED Final   Vibrio cholerae NOT DETECTED NOT DETECTED Final   Enteroaggregative E coli (EAEC) NOT DETECTED NOT DETECTED Final   Enteropathogenic E coli (EPEC) NOT DETECTED NOT DETECTED Final   Enterotoxigenic E coli (ETEC) NOT DETECTED NOT DETECTED Final   Shiga like toxin producing E coli (STEC) NOT DETECTED NOT DETECTED Final   Shigella/Enteroinvasive E coli (EIEC) NOT DETECTED NOT DETECTED Final   Cryptosporidium NOT DETECTED NOT DETECTED Final   Cyclospora cayetanensis NOT DETECTED NOT DETECTED Final   Entamoeba histolytica NOT DETECTED NOT DETECTED Final   Giardia lamblia NOT DETECTED NOT DETECTED Final   Adenovirus F40/41 NOT DETECTED NOT DETECTED Final   Astrovirus NOT DETECTED NOT DETECTED Final    Norovirus GI/GII NOT DETECTED NOT DETECTED Final   Rotavirus A NOT DETECTED NOT DETECTED Final   Sapovirus (I, II, IV, and V) NOT DETECTED NOT DETECTED Final         Radiology Studies: Dg Chest 2 View  Result Date: 03/31/2016 CLINICAL DATA:  Acute onset of fever and lower back pain. Initial encounter. EXAM: CHEST  2 VIEW COMPARISON:  None. FINDINGS: The lungs are well-aerated. Left midlung opacity raises concern for pneumonia. There is no evidence of pleural effusion or pneumothorax. The heart is normal in size; the mediastinal contour is within normal limits. No acute osseous abnormalities are seen. IMPRESSION: Left midlung opacity raises concern for pneumonia. Electronically Signed   By: Roanna Raider M.D.   On: 03/31/2016 03:33   US Renal  Result Date: 04/01/2016 CLINICAL DATA:  Acute kidney injury. EXAM: RENAL / URINARY TRACT ULTRASOUND COMPLETE COMPARISON:  CT scan dated 03/27/2016 new FINDINGS: Right Kidney: Length: 13.8. Echogenicity within normal limits. No mass. Mild pelvocaliectasis. Left Kidney: Length: 12.7 cm. Echogenicity within normal limits. No mass or hydronephrosis visualized. Bladder: Appears normal for degree of bladder distention. IMPRESSION: No significant abnormality. Minimal pelvocaliectasis of the right kidney. Electronically Signed   By: Francene Boyers M.D.   On: 04/01/2016 10:38   US Abdomen Limited  Result Date: 03/31/2016 CLINICAL DATA:  Increased liver function studies. Fever. Previous cholecystectomy. EXAM: US ABDOMEN LIMITED - RIGHT UPPER QUADRANT COMPARISON:  CT abdomen and pelvis 03/27/2016 FINDINGS: Gallbladder: The gallbladder is surgically absent. Common bile duct: Diameter: 4.3 mm, normal Liver: No focal lesion identified. Within normal limits in parenchymal echogenicity. IMPRESSION: Surgical absence of the gallbladder. No bile duct dilatation. Normal homogeneous liver parenchymal echotexture. Electronically Signed   By: Burman Nieves M.D.   On:  03/31/2016 02:51      Scheduled Meds: . diclofenac sodium  2 g Topical QID  . enoxaparin (LOVENOX) injection  40 mg Subcutaneous Q24H  . famotidine (PEPCID) IV  20 mg Intravenous Q12H   Continuous  Infusions: . sodium chloride 100 mL/hr at 03/31/16 1517     LOS: 0 days    Time spent in minutes: 35    Rosanne Wohlfarth, MD [facility] Pager: [email or web address] Password [email or web address] 04/01/2016, 10:45 AM

## 2016-04-02 DIAGNOSIS — R509 Fever, unspecified: Secondary | ICD-10-CM

## 2016-04-02 DIAGNOSIS — D509 Iron deficiency anemia, unspecified: Secondary | ICD-10-CM

## 2016-04-02 DIAGNOSIS — N179 Acute kidney failure, unspecified: Secondary | ICD-10-CM

## 2016-04-02 DIAGNOSIS — R945 Abnormal results of liver function studies: Secondary | ICD-10-CM

## 2016-04-02 DIAGNOSIS — H578 Other specified disorders of eye and adnexa: Secondary | ICD-10-CM

## 2016-04-02 LAB — CBC
HEMATOCRIT: 28.6 % — AB (ref 39.0–52.0)
HEMOGLOBIN: 10.4 g/dL — AB (ref 13.0–17.0)
MCH: 26.7 pg (ref 26.0–34.0)
MCHC: 36.4 g/dL — ABNORMAL HIGH (ref 30.0–36.0)
MCV: 73.5 fL — AB (ref 78.0–100.0)
Platelets: 369 10*3/uL (ref 150–400)
RBC: 3.89 MIL/uL — ABNORMAL LOW (ref 4.22–5.81)
RDW: 14 % (ref 11.5–15.5)
WBC: 17.3 10*3/uL — AB (ref 4.0–10.5)

## 2016-04-02 LAB — SEDIMENTATION RATE: Sed Rate: 81 mm/hr — ABNORMAL HIGH (ref 0–16)

## 2016-04-02 LAB — COMPREHENSIVE METABOLIC PANEL
ALBUMIN: 2.7 g/dL — AB (ref 3.5–5.0)
ALBUMIN: 3 g/dL — AB (ref 3.5–5.0)
ALK PHOS: 240 U/L — AB (ref 38–126)
ALT: 105 U/L — ABNORMAL HIGH (ref 17–63)
ALT: 112 U/L — AB (ref 17–63)
ANION GAP: 8 (ref 5–15)
ANION GAP: 7 (ref 5–15)
AST: 55 U/L — AB (ref 15–41)
AST: 74 U/L — AB (ref 15–41)
Alkaline Phosphatase: 224 U/L — ABNORMAL HIGH (ref 38–126)
BUN: 21 mg/dL — AB (ref 6–20)
BUN: 21 mg/dL — AB (ref 6–20)
CALCIUM: 7.5 mg/dL — AB (ref 8.9–10.3)
CHLORIDE: 107 mmol/L (ref 101–111)
CO2: 20 mmol/L — ABNORMAL LOW (ref 22–32)
CO2: 19 mmol/L — AB (ref 22–32)
Calcium: 7.6 mg/dL — ABNORMAL LOW (ref 8.9–10.3)
Chloride: 108 mmol/L (ref 101–111)
Creatinine, Ser: 3.54 mg/dL — ABNORMAL HIGH (ref 0.61–1.24)
Creatinine, Ser: 3.66 mg/dL — ABNORMAL HIGH (ref 0.61–1.24)
GFR calc Af Amer: 26 mL/min — ABNORMAL LOW (ref >60)
GFR calc Af Amer: 25 mL/min — ABNORMAL LOW (ref >60)
GFR calc non Af Amer: 21 mL/min — ABNORMAL LOW (ref >60)
GFR, EST NON AFRICAN AMERICAN: 22 mL/min — AB (ref >60)
GLUCOSE: 116 mg/dL — AB (ref 65–99)
GLUCOSE: 132 mg/dL — AB (ref 65–99)
POTASSIUM: 3.7 mmol/L (ref 3.5–5.1)
Potassium: 3.8 mmol/L (ref 3.5–5.1)
SODIUM: 134 mmol/L — AB (ref 135–145)
Sodium: 135 mmol/L (ref 135–145)
Total Bilirubin: 0.9 mg/dL (ref 0.3–1.2)
Total Bilirubin: 1.3 mg/dL — ABNORMAL HIGH (ref 0.3–1.2)
Total Protein: 6.4 g/dL — ABNORMAL LOW (ref 6.5–8.1)
Total Protein: 7 g/dL (ref 6.5–8.1)

## 2016-04-02 LAB — PROTEIN / CREATININE RATIO, URINE
CREATININE, URINE: 50.29 mg/dL
Protein Creatinine Ratio: 0.2 mg/mg{Cre} — ABNORMAL HIGH (ref 0.00–0.15)
Total Protein, Urine: 10 mg/dL

## 2016-04-02 LAB — URINALYSIS, ROUTINE W REFLEX MICROSCOPIC
BILIRUBIN URINE: NEGATIVE
GLUCOSE, UA: NEGATIVE mg/dL
Hgb urine dipstick: NEGATIVE
Ketones, ur: NEGATIVE mg/dL
Leukocytes, UA: NEGATIVE
NITRITE: NEGATIVE
PH: 6 (ref 5.0–8.0)
Protein, ur: NEGATIVE mg/dL
SPECIFIC GRAVITY, URINE: 1.004 — AB (ref 1.005–1.030)

## 2016-04-02 LAB — OCCULT BLOOD X 1 CARD TO LAB, STOOL: FECAL OCCULT BLD: NEGATIVE

## 2016-04-02 LAB — ERYTHROPOIETIN: Erythropoietin: 5.7 m[IU]/mL (ref 2.6–18.5)

## 2016-04-02 LAB — HEPATITIS PANEL, ACUTE
HCV AB: 0.1 {s_co_ratio} (ref 0.0–0.9)
HEP A IGM: NEGATIVE
HEP B C IGM: NEGATIVE
HEP B S AG: NEGATIVE

## 2016-04-02 LAB — GLUCOSE, CAPILLARY: Glucose-Capillary: 107 mg/dL — ABNORMAL HIGH (ref 65–99)

## 2016-04-02 LAB — LACTATE DEHYDROGENASE: LDH: 212 U/L — AB (ref 98–192)

## 2016-04-02 LAB — CK: Total CK: 125 U/L (ref 49–397)

## 2016-04-02 MED ORDER — FAMOTIDINE IN NACL 20-0.9 MG/50ML-% IV SOLN: 20.0000 mg | INTRAVENOUS | Status: DC

## 2016-04-02 MED ORDER — DOXYCYCLINE HYCLATE 100 MG IV SOLR
100.0000 mg | Freq: Two times a day (BID) | INTRAVENOUS | Status: DC
  Administered 2016-04-02 (×2): 100 mg via INTRAVENOUS
  Filled 2016-04-02 (×3): qty 100

## 2016-04-02 NOTE — Consult Note (Signed)
[facility] Consult Note     Date: 04/02/2016                  Patient Name:  Jason Villarreal  MRN: [id number]  DOB: 02-14-1991  Age / Sex: 25 y.o., male         PCP: No PCP Per Patient                 Service Requesting Consult: Hospitalist                 Reason for Consult: Acute kidney injury            Chief Complaint: fever and diarrhea  HPI: Pt is a 62 M with a PMH significant for cholecystitis s/p lap chole 2016 who presented to [facility] for evaluation of fevers and abd pain/ diarrhea.  Now seen in consultation at the request of Dr. Wynelle Cleveland for evaluation and management of acute kidney injury.  Pt started feeling poorly a couple of days before 9/19 when he first presented to the ED . He had fevers and chills followed by his GI symptoms (loose stools).  Hasn't seen any blood.  He was given Zofran and PO challenged.  CT scan didn't reveal an acute process and so he was d/c'd.  He continued to feel poorly so he called an on-call physician and ultimately got cipro which he took last Friday and Saturday.  Still having fevers so he went to the ED where he was admitted for workup.    Baseline creatinine appears to be 0.7-0.8.  Cr was 1.2 on 9/19 and has uptrended to 1.50-->1.57-->2.28-->3.54 today 9/26.    He does not note decreased urine output.  Urine shows small amount of Hgb.  He is receiving IV iron for iron deficiency.  Retic cts inappropriate.    Pt reports photophobia and bloodshot eyes on admission but nothing in several days.    Past Medical History:  Diagnosis Date  . Cholecystitis     Past Surgical History:  Procedure Laterality Date  . CHOLECYSTECTOMY N/A 04/16/2015   Procedure: LAPAROSCOPIC CHOLECYSTECTOMY ;  Surgeon: Alphonsa Overall, MD;  Location: [facility];  Service: General;  Laterality: N/A;    Family History  Problem Relation Age of Onset  . Diabetes Father    Social History:  reports that he has never smoked. He has never used smokeless tobacco.  He reports that he does not drink alcohol. His drug history is not on file.  Allergies:  Allergies  Allergen Reactions  . Penicillins     Per mom since childhood Has patient had a PCN reaction causing immediate rash, facial/tongue/throat swelling, SOB or lightheadedness with hypotension: No Has patient had a PCN reaction causing severe rash involving mucus membranes or skin necrosis: No Has patient had a PCN reaction that required hospitalization No Has patient had a PCN reaction occurring within the last 10 years: No If all of the above answers are "NO", then may proceed with Cephalosporin use.     Medications Prior to Admission  Medication Sig Dispense Refill  . acetaminophen (TYLENOL) 500 MG tablet Take 500 mg by mouth every 6 (six) hours as needed for mild pain.    . ciprofloxacin (CIPRO) 500 MG tablet Take 1 tablet (500 mg total) by mouth 2 (two) times daily. 10 tablet 0  . omeprazole (PRILOSEC) 20 MG capsule Take 1 capsule (20 mg total) by mouth daily. 30 capsule 0  . ondansetron (ZOFRAN ODT) 8 MG  disintegrating tablet 77m ODT q8 hours prn nausea 12 tablet 0  . dicyclomine (BENTYL) 10 MG capsule Take 1 pill before meals and at bedtime only if needed for abdominal pain. (Patient not taking: Reported on 03/27/2016) 30 capsule 0    Results for orders placed or performed during the hospital encounter of 03/30/16 (from the past 48 hour(s))  Comprehensive metabolic panel     Status: Abnormal   Collection Time: 04/01/16  1:09 AM  Result Value Ref Range   Sodium 134 (L) 135 - 145 mmol/L   Potassium 3.6 3.5 - 5.1 mmol/L   Chloride 103 101 - 111 mmol/L   CO2 23 22 - 32 mmol/L   Glucose, Bld 133 (H) 65 - 99 mg/dL   BUN 18 6 - 20 mg/dL   Creatinine, Ser 2.28 (H) 0.61 - 1.24 mg/dL   Calcium 7.5 (L) 8.9 - 10.3 mg/dL   Total Protein 6.7 6.5 - 8.1 g/dL   Albumin 3.1 (L) 3.5 - 5.0 g/dL   AST 68 (H) 15 - 41 U/L   ALT 123 (H) 17 - 63 U/L   Alkaline Phosphatase 208 (H) 38 - 126 U/L   Total  Bilirubin 1.1 0.3 - 1.2 mg/dL   GFR calc non Af Amer 38 (L) >60 mL/min   GFR calc Af Amer 44 (L) >60 mL/min    Comment: (NOTE) The eGFR has been calculated using the CKD EPI equation. This calculation has not been validated in all clinical situations. eGFR's persistently <60 mL/min signify possible Chronic Kidney Disease.    Anion gap 8 5 - 15  Ferritin     Status: Abnormal   Collection Time: 04/01/16  1:09 AM  Result Value Ref Range   Ferritin 742 (H) 24 - 336 ng/mL    Comment: Performed at [facility] Iron and TIBC     Status: Abnormal   Collection Time: 04/01/16  1:09 AM  Result Value Ref Range   Iron 27 (L) 45 - 182 ug/dL   TIBC 251 250 - 450 ug/dL   Saturation Ratios 11 (L) 17.9 - 39.5 %   UIBC 224 ug/dL    Comment: [facility] at [facility] Reticulocytes     Status: Abnormal   Collection Time: 04/01/16  1:09 AM  Result Value Ref Range   Retic Ct Pct 0.7 0.4 - 3.1 %   RBC. 4.01 (L) 4.22 - 5.81 MIL/uL   Retic Count, Manual 28.1 19.0 - 186.0 K/uL  CBC     Status: Abnormal   Collection Time: 04/01/16  1:09 AM  Result Value Ref Range   WBC 16.5 (H) 4.0 - 10.5 K/uL   RBC 4.01 (L) 4.22 - 5.81 MIL/uL   Hemoglobin 10.9 (L) 13.0 - 17.0 g/dL   HCT 29.8 (L) 39.0 - 52.0 %   MCV 74.3 (L) 78.0 - 100.0 fL   MCH 27.2 26.0 - 34.0 pg   MCHC 36.6 (H) 30.0 - 36.0 g/dL   RDW 13.8 11.5 - 15.5 %   Platelets 313 150 - 400 K/uL  Vitamin B12     Status: None   Collection Time: 04/01/16  1:09 AM  Result Value Ref Range   Vitamin B-12 825 180 - 914 pg/mL    Comment: (NOTE) This assay is not validated for testing neonatal or myeloproliferative syndrome specimens for Vitamin B12 levels. [facility]formed at [facility]  Folate     Status: None   Collection Time: 04/01/16  1:09 AM  Result Value Ref Range   Folate 7.9 >5.9 ng/mL    Comment: Performed at [facility]  Lactic acid, plasma     Status: None   Collection Time: 04/01/16  1:09 AM  Result Value Ref  Range   Lactic Acid, Venous 1.3 0.5 - 1.9 mmol/L  Lactic acid, plasma     Status: None   Collection Time: 04/01/16  4:05 AM  Result Value Ref Range   Lactic Acid, Venous 1.5 0.5 - 1.9 mmol/L  Glucose, capillary     Status: Abnormal   Collection Time: 04/01/16  8:05 AM  Result Value Ref Range   Glucose-Capillary 107 (H) 65 - 99 mg/dL  Save smear     Status: None   Collection Time: 04/01/16  9:30 AM  Result Value Ref Range   Smear Review SMEAR STAINED AND AVAILABLE FOR REVIEW   Urinalysis, Routine w reflex microscopic (not at [facility])     Status: Abnormal   Collection Time: 04/01/16 10:23 AM  Result Value Ref Range   Color, Urine YELLOW YELLOW   APPearance CLEAR CLEAR   Specific Gravity, Urine 1.005 1.005 - 1.030   pH 6.0 5.0 - 8.0   Glucose, UA NEGATIVE NEGATIVE mg/dL   Hgb urine dipstick SMALL (A) NEGATIVE   Bilirubin Urine NEGATIVE NEGATIVE   Ketones, ur NEGATIVE NEGATIVE mg/dL   Protein, ur NEGATIVE NEGATIVE mg/dL   Nitrite NEGATIVE NEGATIVE   Leukocytes, UA NEGATIVE NEGATIVE  Sodium, urine, random     Status: None   Collection Time: 04/01/16 10:23 AM  Result Value Ref Range   Sodium, Ur <10 mmol/L    Comment: Performed at [facility]  Creatinine, urine, random     Status: None   Collection Time: 04/01/16 10:23 AM  Result Value Ref Range   Creatinine, Urine 49.87 mg/dL    Comment: Performed at [facility]  Osmolality, urine     Status: Abnormal   Collection Time: 04/01/16 10:23 AM  Result Value Ref Range   Osmolality, Ur 131 (L) 300 - 900 mOsm/kg    Comment: Performed at [facility]  Urine microscopic-add on     Status: Abnormal   Collection Time: 04/01/16 10:23 AM  Result Value Ref Range   Squamous Epithelial / LPF 0-5 (A) NONE SEEN   WBC, UA 0-5 0 - 5 WBC/hpf   RBC / HPF 0-5 0 - 5 RBC/hpf   Bacteria, UA FEW (A) NONE SEEN  Erythropoietin     Status: None   Collection Time: 04/01/16 11:57 AM  Result Value Ref Range   Erythropoietin 5.7 2.6 -  18.5 mIU/mL    Comment: (NOTE) Siemens Immulite 2000 Immunochemiluminometric assay [facility]) Performed At: [facility] [address] [place], [place] [id number] Lindon Romp MD MB:[id number]   Occult blood card to lab, stool     Status: Abnormal   Collection Time: 04/01/16  6:00 PM  Result Value Ref Range   Fecal Occult Bld POSITIVE (A) NEGATIVE  Occult blood card to lab, stool     Status: None   Collection Time: 04/01/16 10:00 PM  Result Value Ref Range   Fecal Occult Bld NEGATIVE NEGATIVE  Lactate dehydrogenase     Status: Abnormal   Collection Time: 04/02/16  4:33 AM  Result Value Ref Range   LDH 212 (H) 98 - 192 U/L  Occult blood card to lab, stool     Status: None   Collection Time: 04/02/16  6:37 AM  Result  Value Ref Range   Fecal Occult Bld NEGATIVE NEGATIVE  Comprehensive metabolic panel     Status: Abnormal   Collection Time: 04/02/16  7:56 AM  Result Value Ref Range   Sodium 135 135 - 145 mmol/L   Potassium 3.7 3.5 - 5.1 mmol/L   Chloride 107 101 - 111 mmol/L   CO2 20 (L) 22 - 32 mmol/L   Glucose, Bld 116 (H) 65 - 99 mg/dL   BUN 21 (H) 6 - 20 mg/dL    Comment: REPEATED TO VERIFY   Creatinine, Ser 3.54 (H) 0.61 - 1.24 mg/dL    Comment: REPEATED TO VERIFY DELTA CHECK NOTED    Calcium 7.6 (L) 8.9 - 10.3 mg/dL   Total Protein 6.4 (L) 6.5 - 8.1 g/dL   Albumin 2.7 (L) 3.5 - 5.0 g/dL   AST 55 (H) 15 - 41 U/L   ALT 105 (H) 17 - 63 U/L   Alkaline Phosphatase 224 (H) 38 - 126 U/L   Total Bilirubin 0.9 0.3 - 1.2 mg/dL   GFR calc non Af Amer 22 (L) >60 mL/min   GFR calc Af Amer 26 (L) >60 mL/min    Comment: (NOTE) The eGFR has been calculated using the CKD EPI equation. This calculation has not been validated in all clinical situations. eGFR's persistently <60 mL/min signify possible Chronic Kidney Disease.    Anion gap 8 5 - 15  CBC     Status: Abnormal   Collection Time: 04/02/16  7:56 AM  Result Value Ref Range   WBC 17.3 (H) 4.0 - 10.5 K/uL   RBC  3.89 (L) 4.22 - 5.81 MIL/uL   Hemoglobin 10.4 (L) 13.0 - 17.0 g/dL   HCT 28.6 (L) 39.0 - 52.0 %   MCV 73.5 (L) 78.0 - 100.0 fL   MCH 26.7 26.0 - 34.0 pg   MCHC 36.4 (H) 30.0 - 36.0 g/dL   RDW 14.0 11.5 - 15.5 %   Platelets 369 150 - 400 K/uL  Glucose, capillary     Status: Abnormal   Collection Time: 04/02/16  8:06 AM  Result Value Ref Range   Glucose-Capillary 107 (H) 65 - 99 mg/dL   Comment 1 Notify RN    Comment 2 Document in Chart    US Renal  Result Date: 04/01/2016 CLINICAL DATA:  Acute kidney injury. EXAM: RENAL / URINARY TRACT ULTRASOUND COMPLETE COMPARISON:  CT scan dated 03/27/2016 new FINDINGS: Right Kidney: Length: 13.8. Echogenicity within normal limits. No mass. Mild pelvocaliectasis. Left Kidney: Length: 12.7 cm. Echogenicity within normal limits. No mass or hydronephrosis visualized. Bladder: Appears normal for degree of bladder distention. IMPRESSION: No significant abnormality. Minimal pelvocaliectasis of the right kidney. Electronically Signed   By: Lorriane Shire M.D.   On: 04/01/2016 10:38    Review of Systems  All other systems reviewed and are negative.   Blood pressure 137/88, pulse 86, temperature 100 F (37.8 C), temperature source Oral, resp. rate 18, height '5\' 9"'  (1.753 m), weight 103.2 kg (227 lb 8.2 oz), SpO2 98 %. Physical Exam  GEN: well-appearing, NAD HEENT: EOMI, PERRL, sclerae anicteric and non-injected NECK: Supple, no LAD or JVD PULM: CTAB no c/w/r CV : RRR no m/r/g ABD: soft, obese, nondistended, nontender, NABS NEURO: Aao x 3 SKIN: no rashes or lesions MSK: no synovitis.   Assessment/Plan  1.  Acute kidney injury: Unclear etiology.  Suspect ATN due to poor PO intake in the setting of an infectious process.  Don't suspect TMA as would  see signs of hemolysis (LDH minimally elevated, haptoglobin pending) and would expect to see thrombocytopenia as well.  C diff negative and GI path panel neg although pt had received some cipro so may not be  accurate.  Treatment for this would be supportive.  I am also sending off complements and an ANCA panel as well as an ESR in case any of this is related to an autoimmune disorder.  Serologic testing for hepatitis and HIV has been negative.  I am interested to see what the CK will show.  If everything is inconclusive and cr continues to rise we may offer him a kidney biopsy.  Of note, rarely cipro can cause an AIN so will keep this in mind too as biopsy is considered.  Will repeat UA and send off UP/C as well.  Continue strict I/O.  2.  Fevers:  Per primary- all infectious testing has been negative so far although his clinical presentation fits most closely with a viral syndrome- tick borne illness?  Getting doxycycline.  Ehrlichia, RMSF, lepto, CMV, and B. burgdorfi testing all pending.  ? CAP on CXR from 9/24.  3.  Transaminitis: resolving.  Initially there was some concern for retained CBD stone but CT negative.    4.  Anemia: iron deficient, getting Ferraheme.  Occult blood positive 1/3.  Given fevers, iron def anemia, and GI complaints combined with bloodshot eyes- wonder if IBD is a possibility although it would be expected for CT to show at least some bowel thickening.  ESR as above.  If clinical suspicion remains could consider scopes.  GI following.   Madelon Lips, MD [facility] Cell: [phone] Pgr: [phone] 04/02/2016, 1:28 PM

## 2016-04-02 NOTE — Consult Note (Signed)
[facility]    Date of Admission:  03/30/2016   ciprofloxacin 9/22-24        ceftriaxone and azithromycin 9/23        Day 1 doxycycline  Reason for Consult: Fever, elevated liver enzymes and acute renal insufficiency    Referring Physician: Dr. Calvert CantorSaima Rizwan  Principal Problem:   Febrile illness, acute Active Problems:   Nausea vomiting and diarrhea   AKI (acute kidney injury) (HCC)   Elevated liver function tests   Status post laparoscopic cholecystectomy   Hyponatremia   Iron deficiency anemia   . diclofenac sodium  2 g Topical QID  . doxycycline (VIBRAMYCIN) IV  100 mg Intravenous Q12H  . enoxaparin (LOVENOX) injection  40 mg Subcutaneous Q24H  . [START ON 04/03/2016] famotidine (PEPCID) IV  20 mg Intravenous Q24H  . folic acid  2 mg Oral Daily    Recommendations: 1. Continue doxycycline for now 2. HIV viral load 3. Await results of CMV IgM and Leptospira antibodies 4. Consider nephrology consult   Assessment: The cause of his acute febrile illness remains unclear. I do not think that he has pneumonia. Admission blood cultures were negative but could be falsely negative because of prior antibiotic therapy with ciprofloxacin. This could be an acute viral illness. Cytomegalovirus can cause hepatitis and prolonged fevers and otherwise healthy adults. His conjunctival redness suggests the possibility of adenoma viral infection. His HIV antibody is negative but we need to rule out primary, acute HIV infection. A very rare and unlikely possibility as leptospirosis which can cause fever, red eyes, hepatitis and renal insufficiency (Weil syndrome). I note that he had positive hemoglobin on recent urinalyses but no red blood cells seen suggesting the possibility of rhabdomyolysis. However, CK obtained this morning was within the normal range. Overall he seems to be improving with the exception of worsening renal function.   HPI: Jason Villarreal  is a 25 y.o. male who developed sudden onset of fever, chills and headache 9 days ago. Shortly thereafter he began to have nausea, vomiting and diarrhea. He was seen in the emergency department on 03/26/2016. His liver enzymes were noted to be elevated. He was hospitalized last October with acute cholecystitis and underwent laparoscopic cholecystectomy. His liver enzymes were elevated at that time. He was never reevaluated with repeat enzymes between that hospitalization and this acute illness. After being seen in the emergency department he was referred back to his GI doctor, Dr. Christella HartiganJacobs, who started him on ciprofloxacin for the possibility of retained common bile duct stone with infection. He took NyQuil early in his illness and one dose of acetaminophen just prior to coming to the hospital. He did not improve leading to admission on 03/31/2016. Chest x-ray was showed a possible left midlung infiltrate. He has not had any acute respiratory symptoms. He was given single doses of ceftriaxone and azithromycin that antibiotics were held. He has continued to have some fever and has developed some acute renal insufficiency. He was started on doxycycline this morning.  He states that he is feeling much better. His mother states that he is looking better. His temperature curve seems to be coming down. He is no longer having any headache, nausea, vomiting or diarrhea. His appetite has improved. He and his mother state that he had very red eyes when he was admitted and that has resolved. His transaminases are coming down. The only thing that is getting worse is his  renal function.  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 He went to [facility] and swam in the ocean around [place] Day. He has not had any other recent travel. He denies consuming any raw seafood. He has not had any sick contacts that he is aware of. He has no known recent animal contact. Did not take a sexual history as his mother was present throughout  the exam.   Review of Systems: Review of Systems  Constitutional: Positive for chills, diaphoresis, fever and malaise/fatigue. Negative for weight loss.  HENT: Negative for sore throat.   Eyes: Positive for redness.  Respiratory: Negative for cough, sputum production and shortness of breath.   Cardiovascular: Negative for chest pain.  Gastrointestinal: Positive for diarrhea, nausea and vomiting. Negative for abdominal pain and heartburn.  Genitourinary: Negative for dysuria, frequency and hematuria.       His urine became very dark and just before admission.  Musculoskeletal: Negative for joint pain and myalgias.       He had some transient left lower back pain that he noted after he was admitted to the hospital. It has resolved.  Skin: Negative for rash.  Neurological: Positive for headaches. Negative for dizziness and focal weakness.  Psychiatric/Behavioral: Negative for depression and substance abuse. The patient is not nervous/anxious.     Past Medical History:  Diagnosis Date  . Cholecystitis     Social History  Substance Use Topics  . Smoking status: Never Smoker  . Smokeless tobacco: Never Used  . Alcohol use No    Family History  Problem Relation Age of Onset  . Diabetes Father    Allergies  Allergen Reactions  . Penicillins     Per mom since childhood Has patient had a PCN reaction causing immediate rash, facial/tongue/throat swelling, SOB or lightheadedness with hypotension: No Has patient had a PCN reaction causing severe rash involving mucus membranes or skin necrosis: No Has patient had a PCN reaction that required hospitalization No Has patient had a PCN reaction occurring within the last 10 years: No If all of the above answers are "NO", then may proceed with Cephalosporin use.     OBJECTIVE: Blood pressure 137/88, pulse 86, temperature 100 F (37.8 C), temperature source Oral, resp. rate 18, height 5\' 9"  (1.753 m), weight 227 lb 8.2 oz (103.2 kg),  SpO2 98 %.  Physical Exam  Constitutional: He is oriented to person, place, and time.  He is alert and comfortable sitting up in bed. His mother is visiting.  HENT:  Mouth/Throat: No oropharyngeal exudate.  Eyes: Conjunctivae are normal.  Neck: Neck supple.  Cardiovascular: Normal rate and regular rhythm.   No murmur heard. Pulmonary/Chest: Effort normal and breath sounds normal. He has no wheezes. He has no rales.  Abdominal: Soft. He exhibits no mass. There is no tenderness.  Musculoskeletal: Normal range of motion. He exhibits no edema or tenderness.  Lymphadenopathy:       Head (right side): No submandibular adenopathy present.       Head (left side): No submandibular adenopathy present.    He has no cervical adenopathy.    He has no axillary adenopathy.       Right: No epitrochlear adenopathy present.       Left: No epitrochlear adenopathy present.  Neurological: He is alert and oriented to person, place, and time.  Skin: No rash noted.  Psychiatric: Mood and affect normal.    Lab Results Lab Results  Component Value Date   WBC  17.3 (H) 04/02/2016   HGB 10.4 (L) 04/02/2016   HCT 28.6 (L) 04/02/2016   MCV 73.5 (L) 04/02/2016   PLT 369 04/02/2016    Lab Results  Component Value Date   CREATININE 3.66 (H) 04/02/2016   BUN 21 (H) 04/02/2016   NA 134 (L) 04/02/2016   K 3.8 04/02/2016   CL 108 04/02/2016   CO2 19 (L) 04/02/2016    Lab Results  Component Value Date   ALT 112 (H) 04/02/2016   AST 74 (H) 04/02/2016   ALKPHOS 240 (H) 04/02/2016   BILITOT 1.3 (H) 04/02/2016     Microbiology: Recent Results (from the past 240 hour(s))  Blood culture (routine x 2)     Status: None (Preliminary result)   Collection Time: 03/31/16 12:03 AM  Result Value Ref Range Status   Specimen Description BLOOD RIGHT ANTECUBITAL  Final   Special Requests BOTTLES DRAWN AEROBIC AND ANAEROBIC 5CC  Final   Culture   Final    NO GROWTH 2 DAYS Performed at [facility]     Report Status PENDING  Incomplete  Blood culture (routine x 2)     Status: None (Preliminary result)   Collection Time: 03/31/16  1:49 AM  Result Value Ref Range Status   Specimen Description BLOOD LEFT HAND  Final   Special Requests BOTTLES DRAWN AEROBIC AND ANAEROBIC 5CC  Final   Culture   Final    NO GROWTH 2 DAYS Performed at [facility]    Report Status PENDING  Incomplete  C difficile quick scan w PCR reflex     Status: None   Collection Time: 03/31/16  3:47 AM  Result Value Ref Range Status   C Diff antigen NEGATIVE NEGATIVE Final   C Diff toxin NEGATIVE NEGATIVE Final   C Diff interpretation No C. difficile detected.  Final  Gastrointestinal Panel by PCR , Stool     Status: None   Collection Time: 03/31/16  3:47 AM  Result Value Ref Range Status   Campylobacter species NOT DETECTED NOT DETECTED Final   Plesimonas shigelloides NOT DETECTED NOT DETECTED Final   Salmonella species NOT DETECTED NOT DETECTED Final   Yersinia enterocolitica NOT DETECTED NOT DETECTED Final   Vibrio species NOT DETECTED NOT DETECTED Final   Vibrio cholerae NOT DETECTED NOT DETECTED Final   Enteroaggregative E coli (EAEC) NOT DETECTED NOT DETECTED Final   Enteropathogenic E coli (EPEC) NOT DETECTED NOT DETECTED Final   Enterotoxigenic E coli (ETEC) NOT DETECTED NOT DETECTED Final   Shiga like toxin producing E coli (STEC) NOT DETECTED NOT DETECTED Final   Shigella/Enteroinvasive E coli (EIEC) NOT DETECTED NOT DETECTED Final   Cryptosporidium NOT DETECTED NOT DETECTED Final   Cyclospora cayetanensis NOT DETECTED NOT DETECTED Final   Entamoeba histolytica NOT DETECTED NOT DETECTED Final   Giardia lamblia NOT DETECTED NOT DETECTED Final   Adenovirus F40/41 NOT DETECTED NOT DETECTED Final   Astrovirus NOT DETECTED NOT DETECTED Final   Norovirus GI/GII NOT DETECTED NOT DETECTED Final   Rotavirus A NOT DETECTED NOT DETECTED Final   Sapovirus (I, II, IV, and V) NOT DETECTED NOT DETECTED Final      Cliffton Asters, MD [facility] [phone] pager   [phone] cell 04/02/2016, 3:16 PM

## 2016-04-02 NOTE — Progress Notes (Addendum)
PROGRESS NOTE    Jason Villarreal  ZOX:[id number] DOB: 07-07-1991 DOA: 03/30/2016  PCP: No PCP Per Patient   Brief Narrative:  Jason Villarreal is a 25 y.o. male with medical history significant of  Cholecystitis, s/p of cholecystectomy last year, who presents with fever, nausea, vomiting, diarrhea and back pain. Patient was seen in ED on 03/26/16 ED. He had negative CT abdomen/pelvis, but was found to have abnormal liver function. He was given a referral for GI, but not seen yet. He states that he was given prescription of ciprofloxacin two days ago. Per Dr. Christella Hartigan telephone notes, he was concerned bout possible retained CBD stones. He states that he has been taking antibiotics, but symptoms have not improvement.   Subjective: Fever again yesterday. No nausea, vomiting, diarrhea, cough, dysuria, body aches, joint pains or rash  Assessment & Plan:   Principal Problem: Nausea vomiting and diarrhea/ elevated LFTs/ SIRS with fever, leukocytosis - fevers continue however nausea vomiting and diarrhea have all resolved and he has not developed any new symptoms - LFTs improving - ? Viral - EBV, hepatitis panel, HIV all negative -Have ordered CMV, Lyme Ehrlichia and [place] spotted fever serology and started Doxycycline  -Have discussed with ID and also ordered serology for leptospirosis -Have requested an ID eval- Dr. Orvan Falconer will see him later today - ANA pending  Active Problems:    Hyponatremia/ AKI (acute kidney injury) - dehydration?- Unfortunately, getting worse despite continuous NS infusion and good urine output - renal ultrasound unrevealing other than mild pelvocaliectasis of right kidney - checked Urine studies - he had a moderate amount of Hb on UA since 9/23 and again on repeat UA yesterday - Have asked for nephrology eval- he received 2 doses of Ibuprofen during this admission but no other medications that could have contributed, no episodes of hypotension that could have  resulted in ATN - will check CK  Acidosis - likely from AKI - follow  Anemia -  drop in Hb from 14-15 range last year to 11 now - anemia panel shows low Iron saturation but high ferretin (can be due to it being an acute phase reactant) - retic count normal - has spherocytes on his smear- have asked for hematology input - he has been ordered Feraheme by hematology and is being premedicated with Solumedrol prior to this dose - he has had one stool hemoccult that was positive and subsequently 2 negative- GI has been following     DVT prophylaxis: Lovenox Code Status: Full  Family Communication: mother Disposition Plan: home when stable Consultants:   GI Procedures:    Antimicrobials:  Anti-infectives    Start     Dose/Rate Route Frequency Ordered Stop   04/02/16 1000  doxycycline (VIBRAMYCIN) 100 mg in dextrose 5 % 250 mL IVPB     100 mg 125 mL/hr over 120 Minutes Intravenous Every 12 hours 04/02/16 0917     03/31/16 0800  ciprofloxacin (CIPRO) IVPB 400 mg  Status:  Discontinued     400 mg 200 mL/hr over 60 Minutes Intravenous Every 12 hours 03/31/16 0727 03/31/16 0859   03/31/16 0445  cefTRIAXone (ROCEPHIN) 1 g in dextrose 5 % 50 mL IVPB     1 g 100 mL/hr over 30 Minutes Intravenous  Once 03/31/16 0430 03/31/16 0533   03/31/16 0445  azithromycin (ZITHROMAX) 500 mg in dextrose 5 % 250 mL IVPB     500 mg 250 mL/hr over 60 Minutes Intravenous  Once 03/31/16 0430 03/31/16  0630   03/31/16 0400  aztreonam (AZACTAM) 2 g in dextrose 5 % 50 mL IVPB  Status:  Discontinued     2 g 100 mL/hr over 30 Minutes Intravenous STAT 03/31/16 0353 03/31/16 0439       Objective: Vitals:   04/01/16 2102 04/02/16 0006 04/02/16 0600 04/02/16 0837  BP: 126/78  137/88   Pulse: 93  86   Resp: 18  18   Temp: (!) 101.2 F (38.4 C) 98.9 F (37.2 C) 99.7 F (37.6 C) 100 F (37.8 C)  TempSrc: Oral Oral Oral Oral  SpO2: 96%  98%   Weight:      Height:        Intake/Output Summary (Last 24  hours) at 04/02/16 1228 Last data filed at 04/02/16 1015  Gross per 24 hour  Intake          3471.25 ml  Output             2050 ml  Net          1421.25 ml   Filed Weights   03/31/16 0450  Weight: 103.2 kg (227 lb 8.2 oz)    Examination: General exam: Appears comfortable  HEENT: PERRLA, oral mucosa moist, no sclera icterus or thrush Respiratory system: Clear to auscultation. Respiratory effort normal. Cardiovascular system: S1 & S2 heard, RRR.  No murmurs  Gastrointestinal system: Abdomen soft, non-tender, nondistended. Normal bowel sound. No organomegaly Central nervous system: Alert and oriented. No focal neurological deficits. Extremities: No cyanosis, clubbing or edema Skin: No rashes or ulcers Psychiatry:  Mood & affect appropriate.     Data Reviewed: I have personally reviewed following labs and imaging studies  CBC:  Recent Labs Lab 03/26/16 2358 03/31/16 0002 03/31/16 0905 04/01/16 0109 04/02/16 0756  WBC 8.8 15.1* 16.4* 16.5* 17.3*  NEUTROABS  --  12.7*  --   --   --   HGB 14.6 11.7* 11.4* 10.9* 10.4*  HCT 40.4 31.6* 30.8* 29.8* 28.6*  MCV 74.3* 73.1* 73.7* 74.3* 73.5*  PLT 272 298 313 313 369   Basic Metabolic Panel:  Recent Labs Lab 03/26/16 2358 03/31/16 0002 03/31/16 0621 04/01/16 0109 04/02/16 0756  NA 132* 126* 131* 134* 135  K 3.6 3.5 3.8 3.6 3.7  CL 94* 92* 96* 103 107  CO2 27 23 25 23  20*  GLUCOSE 113* 118* 131* 133* 116*  BUN 10 11 12 18  21*  CREATININE 1.20 1.50* 1.57* 2.28* 3.54*  CALCIUM 9.3 8.4* 8.0* 7.5* 7.6*   GFR: Estimated Creatinine Clearance: 37.4 mL/min (by C-G formula based on SCr of 3.54 mg/dL (H)). Liver Function Tests:  Recent Labs Lab 03/26/16 2358 03/31/16 0002 04/01/16 0109 04/02/16 0756  AST 189* 145* 68* 55*  ALT 214* 181* 123* 105*  ALKPHOS 194* 208* 208* 224*  BILITOT 1.9* 1.9* 1.1 0.9  PROT 9.3* 7.8 6.7 6.4*  ALBUMIN 4.8 3.9 3.1* 2.7*    Recent Labs Lab 03/26/16 2358 03/31/16 0002  LIPASE 23  34   No results for input(s): AMMONIA in the last 168 hours. Coagulation Profile:  Recent Labs Lab 03/31/16 0621  INR 1.08   Cardiac Enzymes: No results for input(s): CKTOTAL, CKMB, CKMBINDEX, TROPONINI in the last 168 hours. BNP (last 3 results) No results for input(s): PROBNP in the last 8760 hours. HbA1C: No results for input(s): HGBA1C in the last 72 hours. CBG:  Recent Labs Lab 03/31/16 0756 04/01/16 0805 04/02/16 0806  GLUCAP 99 107* 107*   Lipid Profile: No  results for input(s): CHOL, HDL, LDLCALC, TRIG, CHOLHDL, LDLDIRECT in the last 72 hours. Thyroid Function Tests:  Recent Labs  03/31/16 0905  TSH 1.195   Anemia Panel:  Recent Labs  04/01/16 0109  VITAMINB12 825  FOLATE 7.9  FERRITIN 742*  TIBC 251  IRON 27*  RETICCTPCT 0.7   Urine analysis:    Component Value Date/Time   COLORURINE YELLOW 04/01/2016 1023   APPEARANCEUR CLEAR 04/01/2016 1023   LABSPEC 1.005 04/01/2016 1023   PHURINE 6.0 04/01/2016 1023   GLUCOSEU NEGATIVE 04/01/2016 1023   HGBUR SMALL (A) 04/01/2016 1023   BILIRUBINUR NEGATIVE 04/01/2016 1023   BILIRUBINUR moderate (A) 04/13/2015 0838   KETONESUR NEGATIVE 04/01/2016 1023   PROTEINUR NEGATIVE 04/01/2016 1023   UROBILINOGEN 0.2 04/14/2015 1929   NITRITE NEGATIVE 04/01/2016 1023   LEUKOCYTESUR NEGATIVE 04/01/2016 1023   Sepsis Labs: @LABRCNTIP (procalcitonin:4,lacticidven:4) ) Recent Results (from the past 240 hour(s))  Blood culture (routine x 2)     Status: None (Preliminary result)   Collection Time: 03/31/16 12:03 AM  Result Value Ref Range Status   Specimen Description BLOOD RIGHT ANTECUBITAL  Final   Special Requests BOTTLES DRAWN AEROBIC AND ANAEROBIC 5CC  Final   Culture   Final    NO GROWTH 1 DAY Performed at [facility]    Report Status PENDING  Incomplete  Blood culture (routine x 2)     Status: None (Preliminary result)   Collection Time: 03/31/16  1:49 AM  Result Value Ref Range Status    Specimen Description BLOOD LEFT HAND  Final   Special Requests BOTTLES DRAWN AEROBIC AND ANAEROBIC 5CC  Final   Culture   Final    NO GROWTH 1 DAY Performed at [facility]    Report Status PENDING  Incomplete  C difficile quick scan w PCR reflex     Status: None   Collection Time: 03/31/16  3:47 AM  Result Value Ref Range Status   C Diff antigen NEGATIVE NEGATIVE Final   C Diff toxin NEGATIVE NEGATIVE Final   C Diff interpretation No C. difficile detected.  Final  Gastrointestinal Panel by PCR , Stool     Status: None   Collection Time: 03/31/16  3:47 AM  Result Value Ref Range Status   Campylobacter species NOT DETECTED NOT DETECTED Final   Plesimonas shigelloides NOT DETECTED NOT DETECTED Final   Salmonella species NOT DETECTED NOT DETECTED Final   Yersinia enterocolitica NOT DETECTED NOT DETECTED Final   Vibrio species NOT DETECTED NOT DETECTED Final   Vibrio cholerae NOT DETECTED NOT DETECTED Final   Enteroaggregative E coli (EAEC) NOT DETECTED NOT DETECTED Final   Enteropathogenic E coli (EPEC) NOT DETECTED NOT DETECTED Final   Enterotoxigenic E coli (ETEC) NOT DETECTED NOT DETECTED Final   Shiga like toxin producing E coli (STEC) NOT DETECTED NOT DETECTED Final   Shigella/Enteroinvasive E coli (EIEC) NOT DETECTED NOT DETECTED Final   Cryptosporidium NOT DETECTED NOT DETECTED Final   Cyclospora cayetanensis NOT DETECTED NOT DETECTED Final   Entamoeba histolytica NOT DETECTED NOT DETECTED Final   Giardia lamblia NOT DETECTED NOT DETECTED Final   Adenovirus F40/41 NOT DETECTED NOT DETECTED Final   Astrovirus NOT DETECTED NOT DETECTED Final   Norovirus GI/GII NOT DETECTED NOT DETECTED Final   Rotavirus A NOT DETECTED NOT DETECTED Final   Sapovirus (I, II, IV, and V) NOT DETECTED NOT DETECTED Final         Radiology Studies: US Renal  Result Date: 04/01/2016  CLINICAL DATA:  Acute kidney injury. EXAM: RENAL / URINARY TRACT ULTRASOUND COMPLETE COMPARISON:  CT scan  dated 03/27/2016 new FINDINGS: Right Kidney: Length: 13.8. Echogenicity within normal limits. No mass. Mild pelvocaliectasis. Left Kidney: Length: 12.7 cm. Echogenicity within normal limits. No mass or hydronephrosis visualized. Bladder: Appears normal for degree of bladder distention. IMPRESSION: No significant abnormality. Minimal pelvocaliectasis of the right kidney. Electronically Signed   By: Francene BoyersJames  Maxwell M.D.   On: 04/01/2016 10:38      Scheduled Meds: . diclofenac sodium  2 g Topical QID  . doxycycline (VIBRAMYCIN) IV  100 mg Intravenous Q12H  . enoxaparin (LOVENOX) injection  40 mg Subcutaneous Q24H  . [START ON 04/03/2016] famotidine (PEPCID) IV  20 mg Intravenous Q24H  . folic acid  2 mg Oral Daily   Continuous Infusions: . sodium chloride 125 mL/hr at 04/02/16 0809     LOS: 1 day    Time spent in minutes: 35    Ola Fawver, MD [facility] Pager: [email or web address] Password [email or web address] 04/02/2016, 12:28 PM

## 2016-04-02 NOTE — Progress Notes (Signed)
Subjective: Feeling better.  Nursing also reports that he felt markedly better with the iron infusion.  No complaints of abdominal pain, nausea, or vomiting.  Objective: Vital signs in last 24 hours: Temp:  [98.9 F (37.2 C)-101.2 F (38.4 C)] 99.6 F (37.6 C) (09/26 1436) Pulse Rate:  [79-93] 79 (09/26 1436) Resp:  [17-18] 17 (09/26 1436) BP: (126-137)/(78-88) 135/82 (09/26 1436) SpO2:  [96 %-100 %] 100 % (09/26 1436) Last BM Date: 04/02/16  Intake/Output from previous day: 09/25 0701 - 09/26 0700 In: 3461.3 [P.O.:480; I.V.:2881.3; IV Piggyback:100] Out: 2450 [Urine:2450] Intake/Output this shift: Total I/O In: 250 [P.O.:250] Out: 520 [Urine:520]  General appearance: alert and no distress GI: soft, non-tender; bowel sounds normal; no masses,  no organomegaly  Lab Results:  Recent Labs  03/31/16 0905 04/01/16 0109 04/02/16 0756  WBC 16.4* 16.5* 17.3*  HGB 11.4* 10.9* 10.4*  HCT 30.8* 29.8* 28.6*  PLT 313 313 369   BMET  Recent Labs  04/01/16 0109 04/02/16 0756 04/02/16 1407  NA 134* 135 134*  K 3.6 3.7 3.8  CL 103 107 108  CO2 23 20* 19*  GLUCOSE 133* 116* 132*  BUN 18 21* 21*  CREATININE 2.28* 3.54* 3.66*  CALCIUM 7.5* 7.6* 7.5*   LFT  Recent Labs  04/02/16 1407  PROT 7.0  ALBUMIN 3.0*  AST 74*  ALT 112*  ALKPHOS 240*  BILITOT 1.3*   PT/INR  Recent Labs  03/31/16 0621  LABPROT 14.0  INR 1.08   Hepatitis Panel  Recent Labs  03/31/16 0621  HEPBSAG Negative  HCVAB 0.1  HEPAIGM Negative  HEPBIGM Negative   C-Diff  Recent Labs  03/31/16 0347  CDIFFTOX NEGATIVE   Fecal Lactopherrin No results for input(s): FECLLACTOFRN in the last 72 hours.  Studies/Results: [place] Renal  Result Date: 04/01/2016 CLINICAL DATA:  Acute kidney injury. EXAM: RENAL / URINARY TRACT ULTRASOUND COMPLETE COMPARISON:  CT scan dated 03/27/2016 new FINDINGS: Right Kidney: Length: 13.8. Echogenicity within normal limits. No mass. Mild pelvocaliectasis. Left  Kidney: Length: 12.7 cm. Echogenicity within normal limits. No mass or hydronephrosis visualized. Bladder: Appears normal for degree of bladder distention. IMPRESSION: No significant abnormality. Minimal pelvocaliectasis of the right kidney. Electronically Signed   By: Francene BoyersJames  Maxwell M.D.   On: 04/01/2016 10:38    Medications:  Scheduled: . diclofenac sodium  2 g Topical QID  . doxycycline (VIBRAMYCIN) IV  100 mg Intravenous Q12H  . enoxaparin (LOVENOX) injection  40 mg Subcutaneous Q24H  . [START ON 04/03/2016] famotidine (PEPCID) IV  20 mg Intravenous Q24H  . folic acid  2 mg Oral Daily   Continuous: . sodium chloride 125 mL/hr at 04/02/16 0809    Assessment/Plan: 1) Acute hepatitis of unknown etiology. 2) Acute renal insufficiency. 3) Fevers.   The patient denies any recent use of antibiotics such as Augmenting and Bactrim.  These two medications are major culprits for abnormal liver enzymes.  This also does not appear to be DRESS syndrome as there is no evidence of eosinophilia.    His liver panel is in an obstructive pattern and it has mildly worsened with the current values, but clinically he reports feeling well.  In the past, when he had choledocholithiasis he had crampy abdominal pain, but he denies this symptom with his current presentation.  The CT scan was negative for any biliary ductal dilation, however, if his liver enzymes continue to worsen in an obstructive pattern, an MRCP may be warranted.  Also, there is a consideration of  a liver biopsy.  Any type of hepatitis can induce fever.  With his initial presentation I am more suspicious of a viral infectious etiology, but the usual viral infection work up was negative.  Although his CMV IgM is still pending.  An autoimmune etiology is a possibility and further work up for this issue may be pursued pending his clinical course and liver panel.  Plan: 1) Await CMV IgM. 2) Follow daily liver panel. 3) Continue with supportive  care.  LOS: 1 day   Demetria Iwai D 04/02/2016, 4:56 PM

## 2016-04-02 NOTE — Progress Notes (Signed)
He does have a (+) stool card for blood (1/3). This may account for the iron deficiency.  I know that GI is following him.  He may need upper/lower endoscopy.  I am not sure why the erythropoietin level is so low.  His renal function is not that bad.    His LDH is minimally elevated.  I will send off a haptoglobin level.  There is no CBC back yet.  He will get the IV iron today.  The hepatitis panel is normal.  I would check his testosterone level just to be thorough.  Christin BachPete Ennever, MD  Jeri ModenaJeremiah 33:3

## 2016-04-03 DIAGNOSIS — D72829 Elevated white blood cell count, unspecified: Secondary | ICD-10-CM

## 2016-04-03 DIAGNOSIS — R7989 Other specified abnormal findings of blood chemistry: Secondary | ICD-10-CM

## 2016-04-03 LAB — COMPREHENSIVE METABOLIC PANEL
ALBUMIN: 2.7 g/dL — AB (ref 3.5–5.0)
ALT: 101 U/L — ABNORMAL HIGH (ref 17–63)
ANION GAP: 7 (ref 5–15)
AST: 48 U/L — AB (ref 15–41)
Alkaline Phosphatase: 223 U/L — ABNORMAL HIGH (ref 38–126)
BUN: 26 mg/dL — AB (ref 6–20)
CHLORIDE: 112 mmol/L — AB (ref 101–111)
CO2: 19 mmol/L — ABNORMAL LOW (ref 22–32)
Calcium: 8.1 mg/dL — ABNORMAL LOW (ref 8.9–10.3)
Creatinine, Ser: 3.32 mg/dL — ABNORMAL HIGH (ref 0.61–1.24)
GFR calc Af Amer: 28 mL/min — ABNORMAL LOW (ref >60)
GFR, EST NON AFRICAN AMERICAN: 24 mL/min — AB (ref >60)
Glucose, Bld: 160 mg/dL — ABNORMAL HIGH (ref 65–99)
POTASSIUM: 4.1 mmol/L (ref 3.5–5.1)
Sodium: 138 mmol/L (ref 135–145)
Total Bilirubin: 1 mg/dL (ref 0.3–1.2)
Total Protein: 6.8 g/dL (ref 6.5–8.1)

## 2016-04-03 LAB — HEMOGLOBINOPATHY EVALUATION
HGB A: 58 % — AB (ref 94.0–98.0)
HGB C: 39.1 % — AB
HGB F QUANT: 0 % (ref 0.0–2.0)
HGB S QUANTITAION: 0 %
Hgb A2 Quant: 2.9 % (ref 0.7–3.1)

## 2016-04-03 LAB — CBC
HCT: 28.6 % — ABNORMAL LOW (ref 39.0–52.0)
Hemoglobin: 10.5 g/dL — ABNORMAL LOW (ref 13.0–17.0)
MCH: 27.2 pg (ref 26.0–34.0)
MCHC: 36.7 g/dL — AB (ref 30.0–36.0)
MCV: 74.1 fL — AB (ref 78.0–100.0)
PLATELETS: 420 10*3/uL — AB (ref 150–400)
RBC: 3.86 MIL/uL — ABNORMAL LOW (ref 4.22–5.81)
RDW: 14.1 % (ref 11.5–15.5)
WBC: 25.9 10*3/uL — AB (ref 4.0–10.5)

## 2016-04-03 LAB — LACTATE DEHYDROGENASE: LDH: 253 U/L — AB (ref 98–192)

## 2016-04-03 LAB — CMV IGM: CMV IgM: <30 AU/mL (ref 0.0–29.9)

## 2016-04-03 LAB — ROCKY MTN SPOTTED FVR ABS PNL(IGG+IGM)
RMSF IgG: NEGATIVE
RMSF IgM: 0.19 index (ref 0.00–0.89)

## 2016-04-03 LAB — GLUCOSE, CAPILLARY: GLUCOSE-CAPILLARY: 128 mg/dL — AB (ref 65–99)

## 2016-04-03 LAB — ANTINUCLEAR ANTIBODIES, IFA: ANTINUCLEAR ANTIBODIES, IFA: NEGATIVE

## 2016-04-03 LAB — B. BURGDORFI ANTIBODIES

## 2016-04-03 LAB — TESTOSTERONE: Testosterone: 39 ng/dL — ABNORMAL LOW (ref 264–916)

## 2016-04-03 NOTE — Progress Notes (Signed)
Subjective: Continually feeling better.   Objective: Vital signs in last 24 hours: Temp:  [97.6 F (36.4 C)-99.6 F (37.6 C)] 98.1 F (36.7 C) (09/27 0547) Pulse Rate:  [65-80] 65 (09/27 0547) Resp:  [17-20] 20 (09/27 0547) BP: (121-135)/(82-89) 130/89 (09/27 0547) SpO2:  [100 %] 100 % (09/27 0547) Last BM Date: 04/03/16  Intake/Output from previous day: 09/26 0701 - 09/27 0700 In: 4930.4 [P.O.:1420; I.V.:3010.4; IV Piggyback:500] Out: 3090 [Urine:3090] Intake/Output this shift: Total I/O In: 450 [P.O.:450] Out: 725 [Urine:725]  General appearance: alert and no distress GI: soft, non-tender; bowel sounds normal; no masses,  no organomegaly  Lab Results:  Recent Labs  04/01/16 0109 04/02/16 0756 04/03/16 0425  WBC 16.5* 17.3* 25.9*  HGB 10.9* 10.4* 10.5*  HCT 29.8* 28.6* 28.6*  PLT 313 369 420*   BMET  Recent Labs  04/02/16 0756 04/02/16 1407 04/03/16 0849  NA 135 134* 138  K 3.7 3.8 4.1  CL 107 108 112*  CO2 20* 19* 19*  GLUCOSE 116* 132* 160*  BUN 21* 21* 26*  CREATININE 3.54* 3.66* 3.32*  CALCIUM 7.6* 7.5* 8.1*   LFT  Recent Labs  04/03/16 0849  PROT 6.8  ALBUMIN 2.7*  AST 48*  ALT 101*  ALKPHOS 223*  BILITOT 1.0   PT/INR No results for input(s): LABPROT, INR in the last 72 hours. Hepatitis Panel No results for input(s): HEPBSAG, HCVAB, HEPAIGM, HEPBIGM in the last 72 hours. C-Diff No results for input(s): CDIFFTOX in the last 72 hours. Fecal Lactopherrin No results for input(s): FECLLACTOFRN in the last 72 hours.  Studies/Results: No results found.  Medications:  Scheduled: . diclofenac sodium  2 g Topical QID  . enoxaparin (LOVENOX) injection  40 mg Subcutaneous Q24H  . folic acid  2 mg Oral Daily   Continuous: . sodium chloride 125 mL/hr at 04/03/16 1135    Assessment/Plan: 1) Acute hepatitis of unknown etiology. 2) ARF. 3) Fever.   Clinically he continues to improve.  He defervesced and his liver enzymes are trending  back down.  Also, his creatinine has improved.  The etiology of his current presentation is still unknown.  Plan: 1) Continue with supportive care.  LOS: 2 days   Shriya Aker D 04/03/2016, 1:05 PM

## 2016-04-03 NOTE — Progress Notes (Signed)
PROGRESS NOTE    Jason Villarreal  ZOX:[id number] DOB: [birth year] DOA: 03/30/2016  PCP: No PCP Per Patient   Brief Narrative:  Jason Villarreal is a 25 y.o. male with medical history significant of  Cholecystitis, s/p of cholecystectomy last year, who presents with fever, nausea, vomiting, diarrhea and back pain. Patient was seen in ED on 03/26/16 ED. He had negative CT abdomen/pelvis, but was found to have abnormal liver function. Back in ED on 9/24, workup significant for worsening LFTs, as well patient developed acute renal failure during hospital stay.  Subjective: No nausea, vomiting or abdominal pain, reports he is feeling much better today, a febrile over last 24 hours   Assessment & Plan:    Nausea vomiting and diarrhea  With SIRS -  reports symptoms significantly improved, afebrile over last 24 hour, denies any further nausea vomiting or diarrhea . - ID consultation appreciated, stopped doxycycline today - Patient with worsening leukocytosis today, possibly related to steroids he received yesterday, otherwise is feeling better, afebrile - EBV, CMV , Lyme, hepatitis panel, HIV all negative(HIV viral load still pending) -Ehrlichia and [place] spotted fever , leptospirosis serology and started Doxycycline  - ID input greatly appreciated  Elevated LFTs - Trending down, GI following, negative hepatitis panel,  AKI (acute kidney injury) - Renal input greatly appreciated, acute renal failure with unclear etiology, nonoliguric, renal function plateauing, he likely ATN in the setting of poor oral intake and infectious process. - Continue to monitor closely  Acidosis - likely from AKI - follow  Anemia -  drop in Hb from 14-15 range last year to 11 now - anemia panel shows low Iron saturation but high ferretin (can be due to it being an acute phase reactant) - retic count normal -Received  Feraheme by hematology andwas  premedicated with Solumedrol prior to this dose - he  has had one stool hemoccult that was positive and subsequently 2 negative- GI has been following     DVT prophylaxis: Lovenox Code Status: Full  Family Communication: mother at bedside Disposition Plan: home when stable Consultants:   GI  Hematology  ID  renal Procedures:    Antimicrobials:  Anti-infectives    Start     Dose/Rate Route Frequency Ordered Stop   04/02/16 1000  doxycycline (VIBRAMYCIN) 100 mg in dextrose 5 % 250 mL IVPB  Status:  Discontinued     100 mg 125 mL/hr over 120 Minutes Intravenous Every 12 hours 04/02/16 0917 04/03/16 0940   03/31/16 0800  ciprofloxacin (CIPRO) IVPB 400 mg  Status:  Discontinued     400 mg 200 mL/hr over 60 Minutes Intravenous Every 12 hours 03/31/16 0727 03/31/16 0859   03/31/16 0445  cefTRIAXone (ROCEPHIN) 1 g in dextrose 5 % 50 mL IVPB     1 g 100 mL/hr over 30 Minutes Intravenous  Once 03/31/16 0430 03/31/16 0533   03/31/16 0445  azithromycin (ZITHROMAX) 500 mg in dextrose 5 % 250 mL IVPB     500 mg 250 mL/hr over 60 Minutes Intravenous  Once 03/31/16 0430 03/31/16 0630   03/31/16 0400  aztreonam (AZACTAM) 2 g in dextrose 5 % 50 mL IVPB  Status:  Discontinued     2 g 100 mL/hr over 30 Minutes Intravenous STAT 03/31/16 0353 03/31/16 0439       Objective: Vitals:   04/02/16 1436 04/02/16 2150 04/03/16 0022 04/03/16 0547  BP: 135/82 121/82  130/89  Pulse: 79 80  65  Resp: 17 20  20  Temp: 99.6 F (37.6 C) 97.6 F (36.4 C) 97.7 F (36.5 C) 98.1 F (36.7 C)  TempSrc: Oral Oral Oral Oral  SpO2: 100% 100%  100%  Weight:      Height:        Intake/Output Summary (Last 24 hours) at 04/03/16 1435 Last data filed at 04/03/16 0855  Gross per 24 hour  Intake          4680.41 ml  Output             3295 ml  Net          1385.41 ml   Filed Weights   03/31/16 0450  Weight: 103.2 kg (227 lb 8.2 oz)    Examination: General exam: Appears comfortable  HEENT: PERRLA, oral mucosa moist, no sclera icterus or  thrush Respiratory system: Clear to auscultation. Respiratory effort normal. Cardiovascular system: S1 & S2 heard, RRR.  No murmurs  Gastrointestinal system: Abdomen soft, non-tender, nondistended. Normal bowel sound. No organomegaly Central nervous system: Alert and oriented. No focal neurological deficits. Extremities: No cyanosis, clubbing or edema Skin: No rashes or ulcers Psychiatry:  Mood & affect appropriate.     Data Reviewed: I have personally reviewed following labs and imaging studies  CBC:  Recent Labs Lab 03/31/16 0002 03/31/16 0905 04/01/16 0109 04/02/16 0756 04/03/16 0425  WBC 15.1* 16.4* 16.5* 17.3* 25.9*  NEUTROABS 12.7*  --   --   --   --   HGB 11.7* 11.4* 10.9* 10.4* 10.5*  HCT 31.6* 30.8* 29.8* 28.6* 28.6*  MCV 73.1* 73.7* 74.3* 73.5* 74.1*  PLT 298 313 313 369 420*   Basic Metabolic Panel:  Recent Labs Lab 03/31/16 0621 04/01/16 0109 04/02/16 0756 04/02/16 1407 04/03/16 0849  NA 131* 134* 135 134* 138  K 3.8 3.6 3.7 3.8 4.1  CL 96* 103 107 108 112*  CO2 25 23 20* 19* 19*  GLUCOSE 131* 133* 116* 132* 160*  BUN 12 18 21* 21* 26*  CREATININE 1.57* 2.28* 3.54* 3.66* 3.32*  CALCIUM 8.0* 7.5* 7.6* 7.5* 8.1*   GFR: Estimated Creatinine Clearance: 39.9 mL/min (by C-G formula based on SCr of 3.32 mg/dL (H)). Liver Function Tests:  Recent Labs Lab 03/31/16 0002 04/01/16 0109 04/02/16 0756 04/02/16 1407 04/03/16 0849  AST 145* 68* 55* 74* 48*  ALT 181* 123* 105* 112* 101*  ALKPHOS 208* 208* 224* 240* 223*  BILITOT 1.9* 1.1 0.9 1.3* 1.0  PROT 7.8 6.7 6.4* 7.0 6.8  ALBUMIN 3.9 3.1* 2.7* 3.0* 2.7*    Recent Labs Lab 03/31/16 0002  LIPASE 34   No results for input(s): AMMONIA in the last 168 hours. Coagulation Profile:  Recent Labs Lab 03/31/16 0621  INR 1.08   Cardiac Enzymes:  Recent Labs Lab 04/02/16 0756  CKTOTAL 125   BNP (last 3 results) No results for input(s): PROBNP in the last 8760 hours. HbA1C: No results for  input(s): HGBA1C in the last 72 hours. CBG:  Recent Labs Lab 03/31/16 0756 04/01/16 0805 04/02/16 0806 04/03/16 0803  GLUCAP 99 107* 107* 128*   Lipid Profile: No results for input(s): CHOL, HDL, LDLCALC, TRIG, CHOLHDL, LDLDIRECT in the last 72 hours. Thyroid Function Tests: No results for input(s): TSH, T4TOTAL, FREET4, T3FREE, THYROIDAB in the last 72 hours. Anemia Panel:  Recent Labs  04/01/16 0109  VITAMINB12 825  FOLATE 7.9  FERRITIN 742*  TIBC 251  IRON 27*  RETICCTPCT 0.7   Urine analysis:    Component Value Date/Time  COLORURINE YELLOW 04/02/2016 1650   APPEARANCEUR CLEAR 04/02/2016 1650   LABSPEC 1.004 (L) 04/02/2016 1650   PHURINE 6.0 04/02/2016 1650   GLUCOSEU NEGATIVE 04/02/2016 1650   HGBUR NEGATIVE 04/02/2016 1650   BILIRUBINUR NEGATIVE 04/02/2016 1650   BILIRUBINUR moderate (A) 04/13/2015 0838   KETONESUR NEGATIVE 04/02/2016 1650   PROTEINUR NEGATIVE 04/02/2016 1650   UROBILINOGEN 0.2 04/14/2015 1929   NITRITE NEGATIVE 04/02/2016 1650   LEUKOCYTESUR NEGATIVE 04/02/2016 1650   Sepsis Labs: @LABRCNTIP (procalcitonin:4,lacticidven:4) ) Recent Results (from the past 240 hour(s))  Blood culture (routine x 2)     Status: None (Preliminary result)   Collection Time: 03/31/16 12:03 AM  Result Value Ref Range Status   Specimen Description BLOOD RIGHT ANTECUBITAL  Final   Special Requests BOTTLES DRAWN AEROBIC AND ANAEROBIC 5CC  Final   Culture   Final    NO GROWTH 2 DAYS Performed at [facility]    Report Status PENDING  Incomplete  Blood culture (routine x 2)     Status: None (Preliminary result)   Collection Time: 03/31/16  1:49 AM  Result Value Ref Range Status   Specimen Description BLOOD LEFT HAND  Final   Special Requests BOTTLES DRAWN AEROBIC AND ANAEROBIC 5CC  Final   Culture   Final    NO GROWTH 2 DAYS Performed at [facility]    Report Status PENDING  Incomplete  C difficile quick scan w PCR reflex     Status: None    Collection Time: 03/31/16  3:47 AM  Result Value Ref Range Status   C Diff antigen NEGATIVE NEGATIVE Final   C Diff toxin NEGATIVE NEGATIVE Final   C Diff interpretation No C. difficile detected.  Final  Gastrointestinal Panel by PCR , Stool     Status: None   Collection Time: 03/31/16  3:47 AM  Result Value Ref Range Status   Campylobacter species NOT DETECTED NOT DETECTED Final   Plesimonas shigelloides NOT DETECTED NOT DETECTED Final   Salmonella species NOT DETECTED NOT DETECTED Final   Yersinia enterocolitica NOT DETECTED NOT DETECTED Final   Vibrio species NOT DETECTED NOT DETECTED Final   Vibrio cholerae NOT DETECTED NOT DETECTED Final   Enteroaggregative E coli (EAEC) NOT DETECTED NOT DETECTED Final   Enteropathogenic E coli (EPEC) NOT DETECTED NOT DETECTED Final   Enterotoxigenic E coli (ETEC) NOT DETECTED NOT DETECTED Final   Shiga like toxin producing E coli (STEC) NOT DETECTED NOT DETECTED Final   Shigella/Enteroinvasive E coli (EIEC) NOT DETECTED NOT DETECTED Final   Cryptosporidium NOT DETECTED NOT DETECTED Final   Cyclospora cayetanensis NOT DETECTED NOT DETECTED Final   Entamoeba histolytica NOT DETECTED NOT DETECTED Final   Giardia lamblia NOT DETECTED NOT DETECTED Final   Adenovirus F40/41 NOT DETECTED NOT DETECTED Final   Astrovirus NOT DETECTED NOT DETECTED Final   Norovirus GI/GII NOT DETECTED NOT DETECTED Final   Rotavirus A NOT DETECTED NOT DETECTED Final   Sapovirus (I, II, IV, and V) NOT DETECTED NOT DETECTED Final         Radiology Studies: No results found.    Scheduled Meds: . diclofenac sodium  2 g Topical QID  . enoxaparin (LOVENOX) injection  40 mg Subcutaneous Q24H  . folic acid  2 mg Oral Daily   Continuous Infusions: . sodium chloride 125 mL/hr at 04/03/16 1135     LOS: 2 days     Toriano Aikey, MD [facility] Pager:[phone] [email or web address] Password [email or web address] 04/03/2016, 2:34 PM

## 2016-04-03 NOTE — Progress Notes (Signed)
[facility]    Date of Admission:  03/30/2016           Day 2 doxycycline  Principal Problem:   Febrile illness, acute Active Problems:   Nausea vomiting and diarrhea   AKI (acute kidney injury) (HCC)   Elevated liver function tests   Status post laparoscopic cholecystectomy   Hyponatremia   Iron deficiency anemia   . diclofenac sodium  2 g Topical QID  . enoxaparin (LOVENOX) injection  40 mg Subcutaneous Q24H  . folic acid  2 mg Oral Daily    SUBJECTIVE: He is feeling much better today. He is not having any nausea, vomiting, diarrhea, or headache.  Review of Systems: Review of Systems  Constitutional: Negative for chills, diaphoresis, fever, malaise/fatigue and weight loss.  HENT: Negative for sore throat.   Respiratory: Positive for cough. Negative for sputum production and shortness of breath.        He has developed a very mild, infrequent dry cough.  Cardiovascular: Negative for chest pain.  Gastrointestinal: Negative for abdominal pain, diarrhea, heartburn, nausea and vomiting.  Genitourinary: Negative for dysuria and frequency.  Musculoskeletal: Negative for joint pain and myalgias.  Skin: Negative for rash.       He has a new bruise in his left antecubital fossa.  Neurological: Negative for dizziness and headaches.    Past Medical History:  Diagnosis Date  . Cholecystitis     Social History  Substance Use Topics  . Smoking status: Never Smoker  . Smokeless tobacco: Never Used  . Alcohol use No    Family History  Problem Relation Age of Onset  . Diabetes Father    Allergies  Allergen Reactions  . Penicillins     Per mom since childhood Has patient had a PCN reaction causing immediate rash, facial/tongue/throat swelling, SOB or lightheadedness with hypotension: No Has patient had a PCN reaction causing severe rash involving mucus membranes or skin necrosis: No Has patient had a PCN reaction that required  hospitalization No Has patient had a PCN reaction occurring within the last 10 years: No If all of the above answers are "NO", then may proceed with Cephalosporin use.     OBJECTIVE: Vitals:   04/02/16 1436 04/02/16 2150 04/03/16 0022 04/03/16 0547  BP: 135/82 121/82  130/89  Pulse: 79 80  65  Resp: 17 20  20   Temp: 99.6 F (37.6 C) 97.6 F (36.4 C) 97.7 F (36.5 C) 98.1 F (36.7 C)  TempSrc: Oral Oral Oral Oral  SpO2: 100% 100%  100%  Weight:      Height:       Body mass index is 33.6 kg/m.  Physical Exam  Constitutional: He is oriented to person, place, and time.  He looks well. He is sitting up in bed. His mother is at the bedside.  HENT:  Mouth/Throat: No oropharyngeal exudate.  Eyes: Conjunctivae are normal.  Cardiovascular: Normal rate and regular rhythm.   No murmur heard. Pulmonary/Chest: Effort normal and breath sounds normal. He has no wheezes. He has no rales.  Abdominal: Soft. He exhibits no mass. There is no tenderness.  Musculoskeletal: Normal range of motion. He exhibits no edema or tenderness.  Lymphadenopathy:       Head (right side): No submandibular adenopathy present.       Head (left side): No submandibular adenopathy present.    He has no cervical adenopathy.    He has no axillary  adenopathy.  Neurological: He is alert and oriented to person, place, and time.  Skin: No rash noted.  He has a bruise in the left antecubital fossa. There is some hyperpigmentation around his left elbow which he states is chronic.  Psychiatric: Mood and affect normal.    Lab Results Lab Results  Component Value Date   WBC 25.9 (H) 04/03/2016   HGB 10.5 (L) 04/03/2016   HCT 28.6 (L) 04/03/2016   MCV 74.1 (L) 04/03/2016   PLT 420 (H) 04/03/2016    Lab Results  Component Value Date   CREATININE 3.32 (H) 04/03/2016   BUN 26 (H) 04/03/2016   NA 138 04/03/2016   K 4.1 04/03/2016   CL 112 (H) 04/03/2016   CO2 19 (L) 04/03/2016    Lab Results  Component Value  Date   ALT 101 (H) 04/03/2016   AST 48 (H) 04/03/2016   ALKPHOS 223 (H) 04/03/2016   BILITOT 1.0 04/03/2016     Microbiology: Recent Results (from the past 240 hour(s))  Blood culture (routine x 2)     Status: None (Preliminary result)   Collection Time: 03/31/16 12:03 AM  Result Value Ref Range Status   Specimen Description BLOOD RIGHT ANTECUBITAL  Final   Special Requests BOTTLES DRAWN AEROBIC AND ANAEROBIC 5CC  Final   Culture   Final    NO GROWTH 2 DAYS Performed at [facility]    Report Status PENDING  Incomplete  Blood culture (routine x 2)     Status: None (Preliminary result)   Collection Time: 03/31/16  1:49 AM  Result Value Ref Range Status   Specimen Description BLOOD LEFT HAND  Final   Special Requests BOTTLES DRAWN AEROBIC AND ANAEROBIC 5CC  Final   Culture   Final    NO GROWTH 2 DAYS Performed at [facility]    Report Status PENDING  Incomplete  C difficile quick scan w PCR reflex     Status: None   Collection Time: 03/31/16  3:47 AM  Result Value Ref Range Status   C Diff antigen NEGATIVE NEGATIVE Final   C Diff toxin NEGATIVE NEGATIVE Final   C Diff interpretation No C. difficile detected.  Final  Gastrointestinal Panel by PCR , Stool     Status: None   Collection Time: 03/31/16  3:47 AM  Result Value Ref Range Status   Campylobacter species NOT DETECTED NOT DETECTED Final   Plesimonas shigelloides NOT DETECTED NOT DETECTED Final   Salmonella species NOT DETECTED NOT DETECTED Final   Yersinia enterocolitica NOT DETECTED NOT DETECTED Final   Vibrio species NOT DETECTED NOT DETECTED Final   Vibrio cholerae NOT DETECTED NOT DETECTED Final   Enteroaggregative E coli (EAEC) NOT DETECTED NOT DETECTED Final   Enteropathogenic E coli (EPEC) NOT DETECTED NOT DETECTED Final   Enterotoxigenic E coli (ETEC) NOT DETECTED NOT DETECTED Final   Shiga like toxin producing E coli (STEC) NOT DETECTED NOT DETECTED Final   Shigella/Enteroinvasive E coli  (EIEC) NOT DETECTED NOT DETECTED Final   Cryptosporidium NOT DETECTED NOT DETECTED Final   Cyclospora cayetanensis NOT DETECTED NOT DETECTED Final   Entamoeba histolytica NOT DETECTED NOT DETECTED Final   Giardia lamblia NOT DETECTED NOT DETECTED Final   Adenovirus F40/41 NOT DETECTED NOT DETECTED Final   Astrovirus NOT DETECTED NOT DETECTED Final   Norovirus GI/GII NOT DETECTED NOT DETECTED Final   Rotavirus A NOT DETECTED NOT DETECTED Final   Sapovirus (I, II, IV, and V) NOT DETECTED  NOT DETECTED Final     ASSESSMENT: He is improving. He has defervesced and his liver enzymes and creatinine are down today. He has developed some worsening leukocytosis but overall he looks better and it appears that his illness is resolving. The cause of his illness remains uncertain. I do not think he needs to stay on doxycycline.  PLAN: Discontinue doxycycline   Cliffton Asters, MD [facility] [phone] pager   [phone] cell 04/03/2016, 9:44 AMPatient ID: Jason Villarreal, male   DOB: [birth year], 25 y.o.   MRN: [id number]

## 2016-04-03 NOTE — Progress Notes (Signed)
[facility] Progress Note    Assessment/ Plan:   1.  Acute kidney injury: Unclear etiology, non-oliguric, renal function plateauing.  Suspect ATN due to poor PO intake in the setting of an infectious process and expect this to improve over the next several days as is typical of ATN.   Complements and an ANCA panel pending. ESR elevated (acute phase reactant). Serologic testing for hepatitis, CMV, and HIV has been negative.  CK WNL.  Can stop IVFs.  Continue strict I/O.  He should see me in clinic as an outpatient to ensure that creatinine back to baseline.  Do not think he needs a biopsy at this time.  2.  Fevers:  Per primary- all infectious testing has been negative so far although his clinical presentation fits most closely with a viral syndrome.  Ehrlichia, RMSF, lepto, testing pending CMV, and B. burgdorfi neg. Doxy d/c'd.  3.  Transaminitis: resolving.  Initially there was some concern for retained CBD stone but CT negative.  GI following.  Likely due to viral process.   4.  Anemia: iron deficient,  Got one dose of Ferraheme.  Occult blood positive 1/3.  Given fevers, iron def anemia, and GI complaints combined with bloodshot eyes- wonder if IBD is a possibility although it would be expected for CT to show at least some bowel thickening.  If clinical suspicion remains could consider scopes.    5.  Leukocytosis: premedicated with 80 IV solumedrol before ferraheme and so partly the increasing leukocytosis is demargination.    Subjective:    Feeling better.  Good appetite.     Objective:   BP 130/89 (BP Location: Left Arm)   Pulse 65   Temp 98.1 F (36.7 C) (Oral)   Resp 20   Ht _0  (1.753 m)   Wt 103.2 kg (227 lb 8.2 oz)   SpO2 100%   BMI 33.60 kg/m   Intake/Output Summary (Last 24 hours) at 04/03/16 1401 Last data filed at 04/03/16 0855  Gross per 24 hour  Intake          4680.41 ml  Output             3295 ml  Net          1385.41 ml   Weight change:    Physical Exam: GEN: well-appearing, NAD.  Family at bedside. HEENT: EOMI, PERRL, sclerae anicteric and non-injected NECK: Supple, no LAD or JVD PULM: CTAB no c/w/r CV : RRR no m/r/g ABD: soft, obese, nondistended, nontender, NABS NEURO: Aao x 3 SKIN: no rashes or lesions EXT: trace LE edema. MSK: no synovitis.   Imaging: No results found.  Labs: BMET  Recent Labs Lab 03/31/16 0002 03/31/16 0621 04/01/16 0109 04/02/16 0756 04/02/16 1407 04/03/16 0849  NA 126* 131* 134* 135 134* 138  K 3.5 3.8 3.6 3.7 3.8 4.1  CL 92* 96* 103 107 108 112*  CO2 _1 20* 19* 19*  GLUCOSE 118* 131* 133* 116* 132* 160*  BUN _2 21* 21* 26*  CREATININE 1.50* 1.57* 2.28* 3.54* 3.66* 3.32*  CALCIUM 8.4* 8.0* 7.5* 7.6* 7.5* 8.1*   CBC  Recent Labs Lab 03/31/16 0002 03/31/16 0905 04/01/16 0109 04/02/16 0756 04/03/16 0425  WBC 15.1* 16.4* 16.5* 17.3* 25.9*  NEUTROABS 12.7*  --   --   --   --   HGB 11.7* 11.4* 10.9* 10.4* 10.5*  HCT 31.6* 30.8* 29.8* 28.6* 28.6*  MCV 73.1* 73.7* 74.3* 73.5* 74.1*  PLT 298 313 313 369 420*    Medications:    . diclofenac sodium  2 g Topical QID  . enoxaparin (LOVENOX) injection  40 mg Subcutaneous Q24H  . folic acid  2 mg Oral Daily      Madelon Lips, MD [facility] Cell: [phone] Pgr: [phone] 04/03/2016, 2:01 PM

## 2016-04-04 LAB — COMPREHENSIVE METABOLIC PANEL
ALBUMIN: 2.5 g/dL — AB (ref 3.5–5.0)
ALK PHOS: 205 U/L — AB (ref 38–126)
ALT: 122 U/L — ABNORMAL HIGH (ref 17–63)
AST: 74 U/L — ABNORMAL HIGH (ref 15–41)
Anion gap: 5 (ref 5–15)
BUN: 27 mg/dL — ABNORMAL HIGH (ref 6–20)
CALCIUM: 8.2 mg/dL — AB (ref 8.9–10.3)
CO2: 21 mmol/L — ABNORMAL LOW (ref 22–32)
CREATININE: 2.72 mg/dL — AB (ref 0.61–1.24)
Chloride: 116 mmol/L — ABNORMAL HIGH (ref 101–111)
GFR, EST AFRICAN AMERICAN: 35 mL/min — AB (ref >60)
GFR, EST NON AFRICAN AMERICAN: 31 mL/min — AB (ref >60)
Glucose, Bld: 97 mg/dL (ref 65–99)
Potassium: 4 mmol/L (ref 3.5–5.1)
SODIUM: 142 mmol/L (ref 135–145)
TOTAL PROTEIN: 6.1 g/dL — AB (ref 6.5–8.1)
Total Bilirubin: 0.6 mg/dL (ref 0.3–1.2)

## 2016-04-04 LAB — C3 COMPLEMENT: C3 COMPLEMENT: 192 mg/dL — AB (ref 82–167)

## 2016-04-04 LAB — CBC WITH DIFFERENTIAL/PLATELET
BASOS ABS: 0 10*3/uL (ref 0.0–0.1)
Basophils Relative: 0 %
EOS ABS: 0 10*3/uL (ref 0.0–0.7)
Eosinophils Relative: 0 %
HCT: 28.4 % — ABNORMAL LOW (ref 39.0–52.0)
HEMOGLOBIN: 10.3 g/dL — AB (ref 13.0–17.0)
LYMPHS PCT: 7 %
Lymphs Abs: 1.8 10*3/uL (ref 0.7–4.0)
MCH: 26 pg (ref 26.0–34.0)
MCHC: 36.3 g/dL — AB (ref 30.0–36.0)
MCV: 71.7 fL — ABNORMAL LOW (ref 78.0–100.0)
MONO ABS: 1 10*3/uL (ref 0.1–1.0)
Monocytes Relative: 4 %
NEUTROS ABS: 22.6 10*3/uL — AB (ref 1.7–7.7)
NEUTROS PCT: 89 %
PLATELETS: 496 10*3/uL — AB (ref 150–400)
RBC: 3.96 MIL/uL — ABNORMAL LOW (ref 4.22–5.81)
RDW: 14 % (ref 11.5–15.5)
WBC: 25.4 10*3/uL — ABNORMAL HIGH (ref 4.0–10.5)

## 2016-04-04 LAB — GLUCOSE, CAPILLARY: GLUCOSE-CAPILLARY: 83 mg/dL (ref 65–99)

## 2016-04-04 LAB — HIV-1 RNA ULTRAQUANT REFLEX TO GENTYP+: HIV-1 RNA Quant, Log: UNDETERMINED log10copy/mL

## 2016-04-04 LAB — ANCA TITERS
Atypical P-ANCA titer: <1:20 {titer}
C-ANCA: <1:20 {titer}
P-ANCA: <1:20 {titer}

## 2016-04-04 LAB — C4 COMPLEMENT: Complement C4, Body Fluid: 48 mg/dL — ABNORMAL HIGH (ref 14–44)

## 2016-04-04 LAB — EHRLICHIA ANTIBODY PANEL
E CHAFFEENSIS AB, IGG: NEGATIVE
E chaffeensis (HGE) Ab, IgM: NEGATIVE
E. CHAFFEENSIS (HME) IGM TITER: NEGATIVE
E. CHAFFEENSIS IGG AB: NEGATIVE

## 2016-04-04 LAB — LACTATE DEHYDROGENASE: LDH: 221 U/L — AB (ref 98–192)

## 2016-04-04 MED ORDER — FOLIC ACID 1 MG PO TABS: 2.0000 mg | ORAL_TABLET | Freq: Every day | ORAL | 0 refills | Status: AC

## 2016-04-04 NOTE — Progress Notes (Signed)
Patient d/c home. Denies pain. D/c instructions and prescriptions given. Verbalized understanding. Stable.

## 2016-04-04 NOTE — Discharge Instructions (Signed)
Follow with Primary MD   Get CBC, CMP,checked  by Primary MD next visit.    Activity: As tolerated    Disposition Home    Diet: Regular diet    On your next visit with your primary care physician please Get Medicines reviewed and adjusted.   Please request your Prim.MD to go over all Hospital Tests and Procedure/Radiological results at the follow up, please get all Hospital records sent to your Prim MD by signing hospital release before you go home.   If you experience worsening of your admission symptoms, develop shortness of breath, life threatening emergency, suicidal or homicidal thoughts you must seek medical attention immediately by calling 911 or calling your MD immediately  if symptoms less severe.  You Must read complete instructions/literature along with all the possible adverse reactions/side effects for all the Medicines you take and that have been prescribed to you. Take any new Medicines after you have completely understood and accpet all the possible adverse reactions/side effects.   Do not drive, operating heavy machinery, perform activities at heights, swimming or participation in water activities or provide baby sitting services if your were admitted for syncope or siezures until you have seen by Primary MD or a Neurologist and advised to do so again.  Do not drive when taking Pain medications.    Do not take more than prescribed Pain, Sleep and Anxiety Medications  Special Instructions: If you have smoked or chewed Tobacco  in the last 2 yrs please stop smoking, stop any regular Alcohol  and or any Recreational drug use.  Wear Seat belts while driving.   Please note  You were cared for by a hospitalist during your hospital stay. If you have any questions about your discharge medications or the care you received while you were in the hospital after you are discharged, you can call the unit and asked to speak with the hospitalist on call if the hospitalist that  took care of you is not available. Once you are discharged, your primary care physician will handle any further medical issues. Please note that NO REFILLS for any discharge medications will be authorized once you are discharged, as it is imperative that you return to your primary care physician (or establish a relationship with a primary care physician if you do not have one) for your aftercare needs so that they can reassess your need for medications and monitor your lab values.

## 2016-04-04 NOTE — Progress Notes (Signed)
[facility] Progress Note    Assessment/ Plan:   1.  Acute kidney injury: Unclear etiology, non-oliguric, renal function plateauing.  Suspect ATN due to poor PO intake in the setting of an infectious process and expect this to improve over the next several days as is typical of ATN.   Complements and an ANCA panel pending. ESR elevated (acute phase reactant). Serologic testing for hepatitis, CMV, and HIV has been negative.  CK WNL.  He should see me in clinic as an outpatient in 1-2 weeks to ensure that creatinine back to baseline.  Do not think he needs a biopsy at this time. Creatinine is downtrending and he is ready for discharge from a renal perspective.  2.  Fevers:  Per primary- all infectious testing has been negative so far although his clinical presentation fits most closely with a viral syndrome.  Ehrlichia, RMSF, lepto, testing pending CMV, and B. burgdorfi neg. Doxy d/c'd.  No further fevers.  3.  Transaminitis: resolving.  Initially there was some concern for retained CBD stone but CT negative.  GI following.  Likely due to viral process.   4.  Anemia: iron deficient,  Got one dose of Ferraheme.  Occult blood positive 1/3.  Given fevers, iron def anemia, and GI complaints combined with bloodshot eyes- wonder if IBD is a possibility although it would be expected for CT to show at least some bowel thickening.  If clinical suspicion remains could consider scopes.    5.  Leukocytosis: premedicated with 80 IV solumedrol before ferraheme and so partly the increasing leukocytosis is demargination.    Subjective:    Feeling well.  Cr coming down.     Objective:   BP (!) 131/95 (BP Location: Left Arm)   Pulse 71   Temp 98.7 F (37.1 C) (Oral)   Resp 20   Ht '5\' 9"'  (1.753 m)   Wt 103.2 kg (227 lb 8.2 oz)   SpO2 100%   BMI 33.60 kg/m   Intake/Output Summary (Last 24 hours) at 04/04/16 1325 Last data filed at 04/04/16 1119  Gross per 24 hour  Intake              1320 ml  Output             4500 ml  Net            -3180 ml   Weight change:   Physical Exam: GEN: well-appearing, NAD.  Family at bedside. HEENT: EOMI, PERRL, sclerae anicteric and non-injected NECK: Supple, no LAD or JVD PULM: CTAB no c/w/r CV : RRR no m/r/g ABD: soft, obese, nondistended, nontender, NABS NEURO: Aao x 3 SKIN: no rashes or lesions EXT: trace LE edema. MSK: no synovitis.   Imaging: No results found.  Labs: BMET  Recent Labs Lab 03/31/16 0002 03/31/16 0621 04/01/16 0109 04/02/16 0756 04/02/16 1407 04/03/16 0849 04/04/16 0427  NA 126* 131* 134* 135 134* 138 142  K 3.5 3.8 3.6 3.7 3.8 4.1 4.0  CL 92* 96* 103 107 108 112* 116*  CO2 '23 25 23 ' 20* 19* 19* 21*  GLUCOSE 118* 131* 133* 116* 132* 160* 97  BUN '11 12 18 ' 21* 21* 26* 27*  CREATININE 1.50* 1.57* 2.28* 3.54* 3.66* 3.32* 2.72*  CALCIUM 8.4* 8.0* 7.5* 7.6* 7.5* 8.1* 8.2*   CBC  Recent Labs Lab 03/31/16 0002  04/01/16 0109 04/02/16 0756 04/03/16 0425 04/04/16 0427  WBC 15.1*  < > 16.5* 17.3* 25.9* 25.4*  NEUTROABS 12.7*  --   --   --   --  22.6*  HGB 11.7*  < > 10.9* 10.4* 10.5* 10.3*  HCT 31.6*  < > 29.8* 28.6* 28.6* 28.4*  MCV 73.1*  < > 74.3* 73.5* 74.1* 71.7*  PLT 298  < > 313 369 420* 496*  < > = values in this interval not displayed.  Medications:    . diclofenac sodium  2 g Topical QID  . enoxaparin (LOVENOX) injection  40 mg Subcutaneous Q24H  . folic acid  2 mg Oral Daily      Madelon Lips, MD [facility] Cell: [phone] Pgr: [phone] 04/04/2016, 1:25 PM

## 2016-04-04 NOTE — Care Management Note (Addendum)
Case Management Note  Patient Details  Name: Jason LennertJonathan L Villarreal MRN: [id number] Date of Birth: 10/03/1990  Subjective/Objective:   25 y.o. M s/p Chlecystectomy 2016, admitted with Elevated LFT's. CM consult for PCP referral.  Lives in private residence with grandmother.                 Action/Plan: Instructed pt in the use of www. [employer] to find PCP. Discussed general characteristics of PCP that would need to be taken into consideration such as location of office, rapport with MD and personal recommendations of  friends and family members.  [name] lives with his grandmother, who is at bedside, and assures this CM that he will find a PCP. No further CM needs at this time.    Expected Discharge Date:  04/02/16               Expected Discharge Plan:     In-House Referral:     Discharge planning Services     Post Acute Care Choice:    Choice offered to:     DME Arranged:    DME Agency:     HH Arranged:    HH Agency:     Status of Service:     If discussed at [employer] Meetings, dates discussed:    Additional Comments:  Yvone NeuCrutchfield, Talayah Picardi M, RN 04/04/2016, 10:20 AM

## 2016-04-04 NOTE — Discharge Summary (Signed)
Jason LennertJonathan L Villarreal, is a 25 y.o. male  DOB Feb 11, 1991  MRN [id number].  Admission date:  03/30/2016  Admitting Physician  Lorretta HarpXilin Niu, MD  Discharge Date:  04/04/2016   Primary MD  No PCP Per Patient  Recommendations for primary care physician for things to follow:  - Please check CBC, CMP in 1 week. - Patient to follow with renal in 2 weeks - Patient to follow with GI in one week - Please follow on final results on HIV RNA quantitative   Admission Diagnosis  Low back pain [M54.5] Hyponatremia [E87.1] Elevated liver function tests [R79.89] Fever [R50.9] AKI (acute kidney injury) (HCC) [N17.9] Febrile illness, acute [R50.9] Acute bilateral low back pain without sciatica [M54.5]   Discharge Diagnosis  Low back pain [M54.5] Hyponatremia [E87.1] Elevated liver function tests [R79.89] Fever [R50.9] AKI (acute kidney injury) (HCC) [N17.9] Febrile illness, acute [R50.9] Acute bilateral low back pain without sciatica [M54.5]    Principal Problem:   Febrile illness, acute Active Problems:   Status post laparoscopic cholecystectomy   Nausea vomiting and diarrhea   Hyponatremia   AKI (acute kidney injury) (HCC)   Elevated liver function tests   Iron deficiency anemia      Past Medical History:  Diagnosis Date  . Cholecystitis     Past Surgical History:  Procedure Laterality Date  . CHOLECYSTECTOMY N/A 04/16/2015   Procedure: LAPAROSCOPIC CHOLECYSTECTOMY ;  Surgeon: Ovidio Kinavid Newman, MD;  Location: [facility];  Service: General;  Laterality: N/A;       History of present illness and  Hospital Course:     Kindly see H&P for history of present illness and admission details, please review complete Labs, Consult reports and Test reports for all details in brief  HPI  from the history and physical done on the day of admission 03/31/2016 HPI: Jason Villarreal is a 25 y.o. male with medical history  significant of  Cholecystitis, s/p of cholecystectomy last year, who presents with fever, nausea, vomiting, diarrhea and back pain.  Patient has been having nausea, vomiting, diarrhea, fever for more than 2 weeks. His vomiting has resolved today, but continues to have nausea and diarrhea. He has at least 3 bowel movements with loose stool each day. No abdominal pain. He has mild fever, but denies cough, chest pain, SOB, running nose, dysuria or burning on urination. Patient states that he drinks a lot of water, at least 10 bottles each day, therefore has increased urinary frequency.  Patient was seen in ED on 03/26/16 ED. He had negative CT abdomen/pelvis, but was found to have abnormal liver function. He was given referral to GI, but not seen yet. He states that he was given prescription of ciprofloxacin by GI, Dr. Christella HartiganJacobs two days ago. Per Dr. Christella HartiganJacobs telephone notes, concerning bout possible retained CBD stones. He states that he has been taking antibiotics, but symptoms have no improvement.   Patient states that he started having back pain since last night. No injury. His back pain is located in the left  lower back in paraspinal muscle area. It is constant, 6 out of 10 in severity, nonradiating. No leg numbness or weakness.  ED Course: pt was found to have WBC 15.1, negative urinalysis (RBC 0-5), lactate 1.26, sodium 126, AKi with creatinine 1.50, temperature 100.6, no tachycardia, no tachypnea, CXR showed left mid ung opacity. US-abd showed surgical absence of the gallbladder. No bile duct dilatation. Normal homogeneous liver parenchymal echotexture. Pt is placed on med-surg bed for obs.   Hospital Course  Jason Mollett Turneris a 24 y.o.malewith medical history significant of Cholecystitis, s/p of cholecystectomy last year, who presents with fever, nausea, vomiting, diarrhea and back pain. Patient was seen in ED on 03/26/16 ED. He had negative CT abdomen/pelvis, but was found to have abnormal  liver function. Back in ED on 9/24, workup significant for worsening LFTs, as well patient developed acute renal failure during hospital stay.  Nausea vomiting and diarrhea  With SIRS -  reports symptoms significantly improved, afebrile over last 48 hour, denies any further nausea vomiting or diarrhea . Patient with good appetite. - ID consultation appreciated, no further need for antibiotics - Patient with worsening leukocytosis during hospital stay, possibly related to steroids as premedication for free heme transfusion, otherwise is feeling better, afebrile - EBV, CMV , Lyme, [facility] spotted fever ,hepatitis panel, HIV antibody nonreactive - HIV viral load still pending at time of discharge -Ehrlichia  , leptospirosis serology still pending on discharge - ID input greatly appreciated, patient currently off antibiotics  Elevated LFTs - Trending down, GI following, negative hepatitis panel, discussed with Dr. Elnoria Howard, patient can be discharged today, he will follow with him early next week  AKI (acute kidney injury) - Renal input greatly appreciated, acute renal failure with unclear etiology, nonoliguric, renal function plateauing, he likely ATN in the setting of poor oral intake and infectious process. Creatinine is improving today. He was 2.7, patient to follow with renal Dr. Signe Colt as an outpatient in couple weeks.  Acidosis - likely from AKI   Anemia -  drop in Hb from 14-15 range last year to 11 now - anemia panel shows low Iron saturation but high ferretin (can be due to it being an acute phase reactant) - retic count normal -Received  Feraheme by hematology andwas  premedicated with Solumedrol prior to this dose - he has had one stool hemoccult that was positive and subsequently 2 negative- GI has been following - Testosterone level is low, might be contributing to anemia, can be worked up as an outpatient if patient still having persistent anemia. - Continue with folate  supplement     Discharge Condition:  Stable   Follow UP  Follow-up Information    Bufford Buttner, MD Follow up in 2 week(s).   Specialty:  Nephrology Contact information: [address] [place] [address] [phone]        Theda Belfast, MD Follow up in 1 week(s).   Specialty:  Gastroenterology Contact information: [address] [place] [address] [phone]             Discharge Instructions  and  Discharge Medications    Discharge Instructions    Discharge instructions    Complete by:  As directed    Follow with Primary MD   Get CBC, CMP,checked  by Primary MD next visit.    Activity: As tolerated    Disposition Home    Diet: Regular diet    On your next visit with your primary care physician please  Get Medicines reviewed and adjusted.   Please request your Prim.MD to go over all Hospital Tests and Procedure/Radiological results at the follow up, please get all Hospital records sent to your Prim MD by signing hospital release before you go home.   If you experience worsening of your admission symptoms, develop shortness of breath, life threatening emergency, suicidal or homicidal thoughts you must seek medical attention immediately by calling 911 or calling your MD immediately  if symptoms less severe.  You Must read complete instructions/literature along with all the possible adverse reactions/side effects for all the Medicines you take and that have been prescribed to you. Take any new Medicines after you have completely understood and accpet all the possible adverse reactions/side effects.   Do not drive, operating heavy machinery, perform activities at heights, swimming or participation in water activities or provide baby sitting services if your were admitted for syncope or siezures until you have seen by Primary MD or a Neurologist and advised to do so again.  Do not drive when taking Pain medications.    Do not take  more than prescribed Pain, Sleep and Anxiety Medications  Special Instructions: If you have smoked or chewed Tobacco  in the last 2 yrs please stop smoking, stop any regular Alcohol  and or any Recreational drug use.  Wear Seat belts while driving.   Please note  You were cared for by a hospitalist during your hospital stay. If you have any questions about your discharge medications or the care you received while you were in the hospital after you are discharged, you can call the unit and asked to speak with the hospitalist on call if the hospitalist that took care of you is not available. Once you are discharged, your primary care physician will handle any further medical issues. Please note that NO REFILLS for any discharge medications will be authorized once you are discharged, as it is imperative that you return to your primary care physician (or establish a relationship with a primary care physician if you do not have one) for your aftercare needs so that they can reassess your need for medications and monitor your lab values.   Increase activity slowly    Complete by:  As directed        Medication List    STOP taking these medications   acetaminophen 500 MG tablet Commonly known as:  TYLENOL   ciprofloxacin 500 MG tablet Commonly known as:  CIPRO   dicyclomine 10 MG capsule Commonly known as:  BENTYL   omeprazole 20 MG capsule Commonly known as:  PRILOSEC   ondansetron 8 MG disintegrating tablet Commonly known as:  ZOFRAN ODT     TAKE these medications   folic acid 1 MG tablet Commonly known as:  FOLVITE Take 2 tablets (2 mg total) by mouth daily. Start taking on:  04/05/2016         Diet and Activity recommendation: See Discharge Instructions above   Consults obtained -    GI  Hematology  ID  renal  Major procedures and Radiology Reports - PLEASE review detailed and final reports for all details, in brief -     Dg Chest 2 View  Result Date:  03/31/2016 CLINICAL DATA:  Acute onset of fever and lower back pain. Initial encounter. EXAM: CHEST  2 VIEW COMPARISON:  None. FINDINGS: The lungs are well-aerated. Left midlung opacity raises concern for pneumonia. There is no evidence of pleural effusion or pneumothorax. The heart is normal  in size; the mediastinal contour is within normal limits. No acute osseous abnormalities are seen. IMPRESSION: Left midlung opacity raises concern for pneumonia. Electronically Signed   By: Roanna Raider M.D.   On: 03/31/2016 03:33   Ct Abdomen Pelvis W Contrast  Result Date: 03/27/2016 CLINICAL DATA:  Acute onset of nausea, vomiting and diarrhea. Initial encounter. EXAM: CT ABDOMEN AND PELVIS WITH CONTRAST TECHNIQUE: Multidetector CT imaging of the abdomen and pelvis was performed using the standard protocol following bolus administration of intravenous contrast. CONTRAST:  ISOVUE-300 IOPAMIDOL (ISOVUE-300) INJECTION 61% COMPARISON:  Abdominal ultrasound performed 04/14/2015 FINDINGS: Lower chest: The visualized lung bases are grossly clear. The visualized portions of the mediastinum are unremarkable. Hepatobiliary: The liver is unremarkable in appearance. The patient is status post cholecystectomy, with clips noted at the gallbladder fossa. The common bile duct remains normal in caliber. Pancreas: The pancreas is within normal limits. Spleen: The spleen is unremarkable in appearance. Adrenals/Urinary Tract: The adrenal glands are unremarkable in appearance. The kidneys are within normal limits. There is no evidence of hydronephrosis. No renal or ureteral stones are identified. No perinephric stranding is seen. Stomach/Bowel: The stomach is unremarkable in appearance. The small bowel is within normal limits. The appendix is normal in caliber, without evidence of appendicitis. The colon is unremarkable in appearance. Vascular/Lymphatic: The abdominal aorta is unremarkable in appearance. The inferior vena cava is  grossly unremarkable. No retroperitoneal lymphadenopathy is seen. No pelvic sidewall lymphadenopathy is identified. Reproductive: The bladder is moderately distended and grossly unremarkable. The prostate remains normal in size. Other: No additional soft tissue abnormalities are seen. Musculoskeletal: No acute osseous abnormalities are identified. The visualized musculature is unremarkable in appearance. IMPRESSION: No acute abnormality seen within the abdomen or pelvis. Electronically Signed   By: Roanna Raider M.D.   On: 03/27/2016 02:22   US Renal  Result Date: 04/01/2016 CLINICAL DATA:  Acute kidney injury. EXAM: RENAL / URINARY TRACT ULTRASOUND COMPLETE COMPARISON:  CT scan dated 03/27/2016 new FINDINGS: Right Kidney: Length: 13.8. Echogenicity within normal limits. No mass. Mild pelvocaliectasis. Left Kidney: Length: 12.7 cm. Echogenicity within normal limits. No mass or hydronephrosis visualized. Bladder: Appears normal for degree of bladder distention. IMPRESSION: No significant abnormality. Minimal pelvocaliectasis of the right kidney. Electronically Signed   By: Francene Boyers M.D.   On: 04/01/2016 10:38   US Abdomen Limited  Result Date: 03/31/2016 CLINICAL DATA:  Increased liver function studies. Fever. Previous cholecystectomy. EXAM: US ABDOMEN LIMITED - RIGHT UPPER QUADRANT COMPARISON:  CT abdomen and pelvis 03/27/2016 FINDINGS: Gallbladder: The gallbladder is surgically absent. Common bile duct: Diameter: 4.3 mm, normal Liver: No focal lesion identified. Within normal limits in parenchymal echogenicity. IMPRESSION: Surgical absence of the gallbladder. No bile duct dilatation. Normal homogeneous liver parenchymal echotexture. Electronically Signed   By: Burman Nieves M.D.   On: 03/31/2016 02:51    Micro Results     Recent Results (from the past 240 hour(s))  Blood culture (routine x 2)     Status: None (Preliminary result)   Collection Time: 03/31/16 12:03 AM  Result Value Ref  Range Status   Specimen Description BLOOD RIGHT ANTECUBITAL  Final   Special Requests BOTTLES DRAWN AEROBIC AND ANAEROBIC 5CC  Final   Culture   Final    NO GROWTH 3 DAYS Performed at [facility]    Report Status PENDING  Incomplete  Blood culture (routine x 2)     Status: None (Preliminary result)   Collection Time: 03/31/16  1:49  AM  Result Value Ref Range Status   Specimen Description BLOOD LEFT HAND  Final   Special Requests BOTTLES DRAWN AEROBIC AND ANAEROBIC 5CC  Final   Culture   Final    NO GROWTH 3 DAYS Performed at [facility]    Report Status PENDING  Incomplete  C difficile quick scan w PCR reflex     Status: None   Collection Time: 03/31/16  3:47 AM  Result Value Ref Range Status   C Diff antigen NEGATIVE NEGATIVE Final   C Diff toxin NEGATIVE NEGATIVE Final   C Diff interpretation No C. difficile detected.  Final  Gastrointestinal Panel by PCR , Stool     Status: None   Collection Time: 03/31/16  3:47 AM  Result Value Ref Range Status   Campylobacter species NOT DETECTED NOT DETECTED Final   Plesimonas shigelloides NOT DETECTED NOT DETECTED Final   Salmonella species NOT DETECTED NOT DETECTED Final   Yersinia enterocolitica NOT DETECTED NOT DETECTED Final   Vibrio species NOT DETECTED NOT DETECTED Final   Vibrio cholerae NOT DETECTED NOT DETECTED Final   Enteroaggregative E coli (EAEC) NOT DETECTED NOT DETECTED Final   Enteropathogenic E coli (EPEC) NOT DETECTED NOT DETECTED Final   Enterotoxigenic E coli (ETEC) NOT DETECTED NOT DETECTED Final   Shiga like toxin producing E coli (STEC) NOT DETECTED NOT DETECTED Final   Shigella/Enteroinvasive E coli (EIEC) NOT DETECTED NOT DETECTED Final   Cryptosporidium NOT DETECTED NOT DETECTED Final   Cyclospora cayetanensis NOT DETECTED NOT DETECTED Final   Entamoeba histolytica NOT DETECTED NOT DETECTED Final   Giardia lamblia NOT DETECTED NOT DETECTED Final   Adenovirus F40/41 NOT DETECTED NOT DETECTED  Final   Astrovirus NOT DETECTED NOT DETECTED Final   Norovirus GI/GII NOT DETECTED NOT DETECTED Final   Rotavirus A NOT DETECTED NOT DETECTED Final   Sapovirus (I, II, IV, and V) NOT DETECTED NOT DETECTED Final       Today   Subjective:   Jason Villarreal today has no headache,no chest abdominal pain,no new weakness tingling or numbness, feels much better wants to go home today.   Objective:   Blood pressure (!) 131/95, pulse 71, temperature 98.7 F (37.1 C), temperature source Oral, resp. rate 20, height 5\' 9"  (1.753 m), weight 103.2 kg (227 lb 8.2 oz), SpO2 100 %.   Intake/Output Summary (Last 24 hours) at 04/04/16 1503 Last data filed at 04/04/16 1119  Gross per 24 hour  Intake             1320 ml  Output             4500 ml  Net            -3180 ml    Exam Awake Alert, Oriented x 3, No new F.N deficits, Normal affect [place].AT,PERRAL Supple Neck,No JVD, No cervical lymphadenopathy appriciated.  Symmetrical Chest wall movement, Good air movement bilaterally, CTAB RRR,No Gallops,Rubs or new Murmurs, No Parasternal Heave +ve B.Sounds, Abd Soft, Non tender, No organomegaly appriciated, No rebound -guarding or rigidity. No Cyanosis, Clubbing or edema, No new Rash or bruise  Data Review   CBC w Diff:  Lab Results  Component Value Date   WBC 25.4 (H) 04/04/2016   HGB 10.3 (L) 04/04/2016   HCT 28.4 (L) 04/04/2016   PLT 496 (H) 04/04/2016   LYMPHOPCT 7 04/04/2016   MONOPCT 4 04/04/2016   EOSPCT 0 04/04/2016   BASOPCT 0 04/04/2016    CMP:  Lab Results  Component Value Date   NA 142 04/04/2016   K 4.0 04/04/2016   CL 116 (H) 04/04/2016   CO2 21 (L) 04/04/2016   BUN 27 (H) 04/04/2016   CREATININE 2.72 (H) 04/04/2016   CREATININE 1.09 04/13/2015   PROT 6.1 (L) 04/04/2016   ALBUMIN 2.5 (L) 04/04/2016   BILITOT 0.6 04/04/2016   ALKPHOS 205 (H) 04/04/2016   AST 74 (H) 04/04/2016   ALT 122 (H) 04/04/2016  .   Total Time in preparing paper work, data evaluation  and todays exam - 35 minutes  Juanitta Earnhardt M.D on 04/04/2016 at 3:03 PM  [facility]   Office  [phone]

## 2016-04-04 NOTE — Plan of Care (Signed)
Problem: Pain Managment: Goal: General experience of comfort will improve Outcome: Progressing Patient denies pain   

## 2016-04-05 LAB — CULTURE, BLOOD (ROUTINE X 2)
CULTURE: NO GROWTH
Culture: NO GROWTH

## 2016-04-05 LAB — LEPTOSPIRA AB SCREEN

## 2016-07-20 IMAGING — RF DG ERCP WO/W SPHINCTEROTOMY
1 series · 3 of 3 positions shown · non-contrast
Comparison: Ultrasound 04/14/2015

CLINICAL DATA: ERCP biliary and pancreatic ducts. Common duct
stones.

EXAM:
ERCP
TECHNIQUE: Multiple spot images obtained with the fluoroscopic device and
submitted for interpretation post-procedure.
FLUOROSCOPY TIME:  1 minutes 41 seconds

[Series 1: run · 3 of 3 slices shown]
[im 1/3]
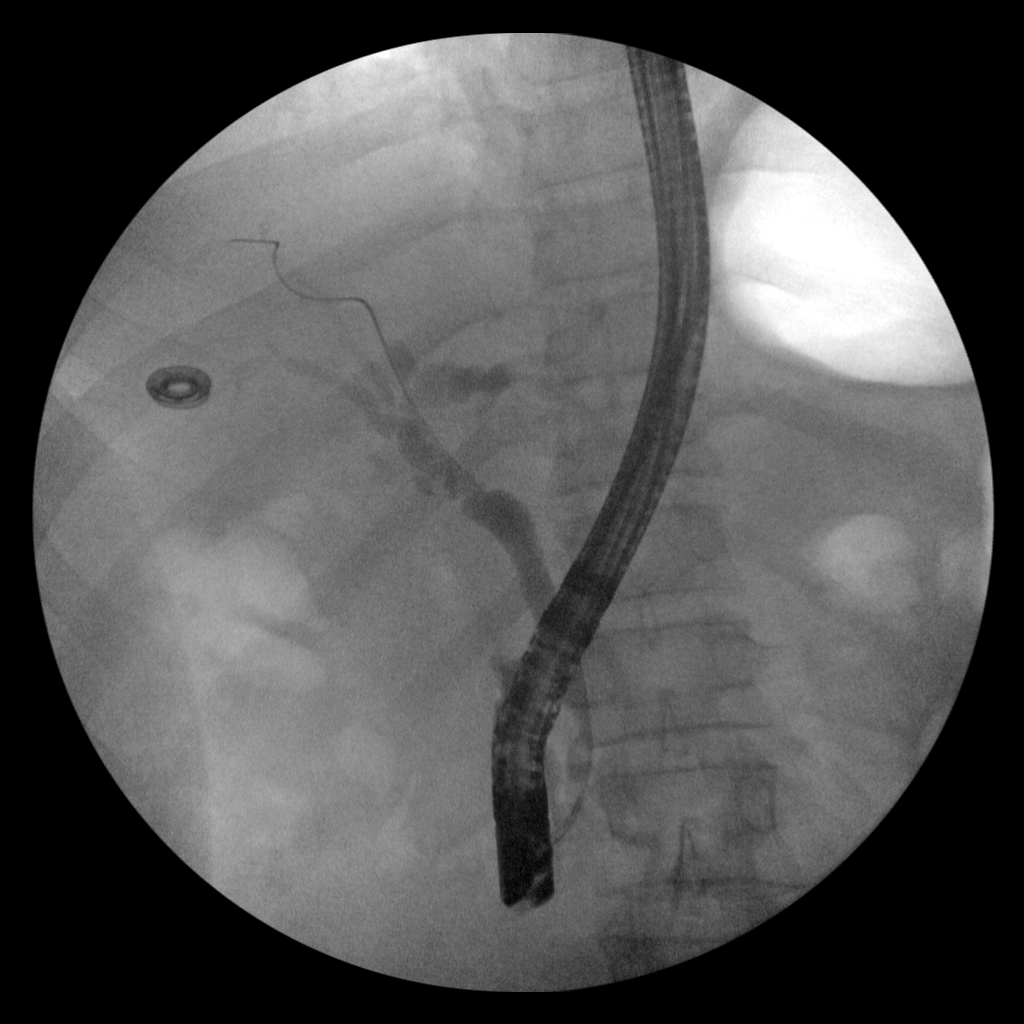
[im 2/3]
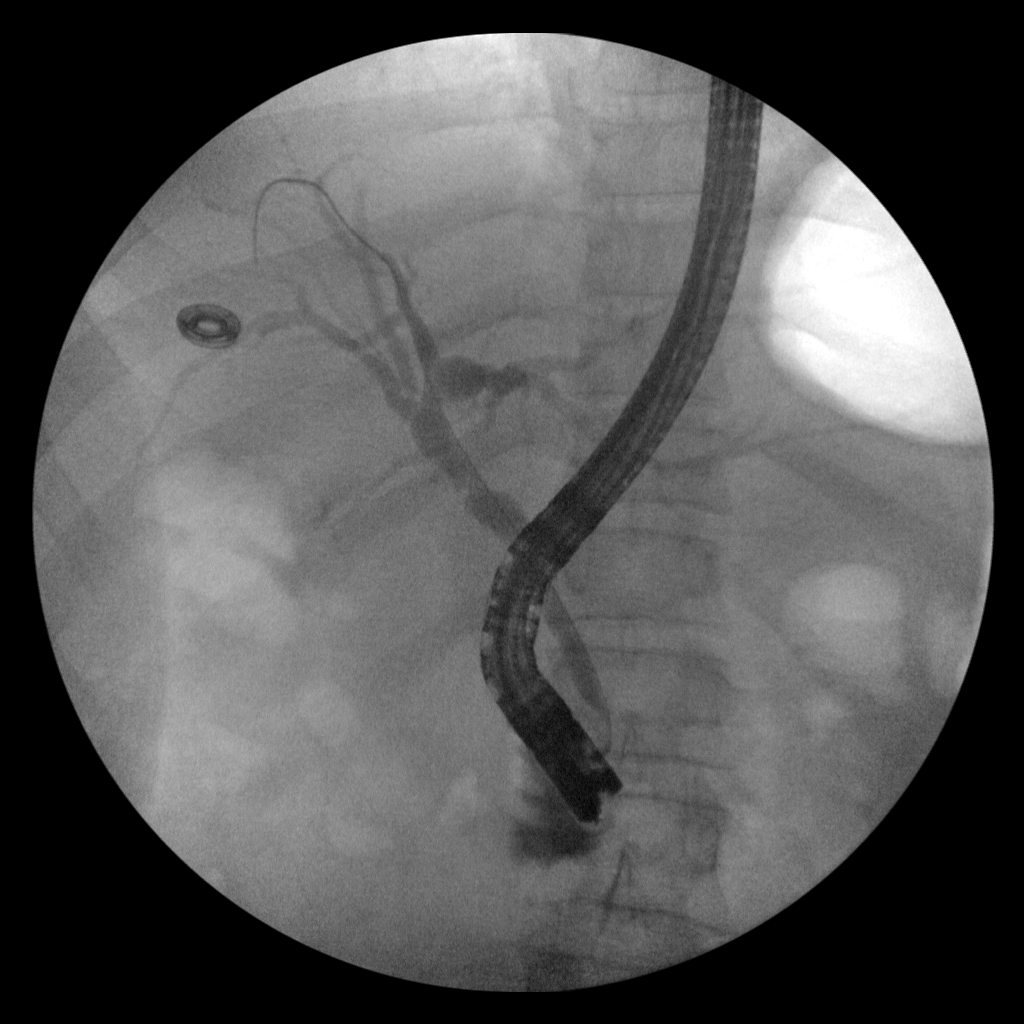
[im 3/3]
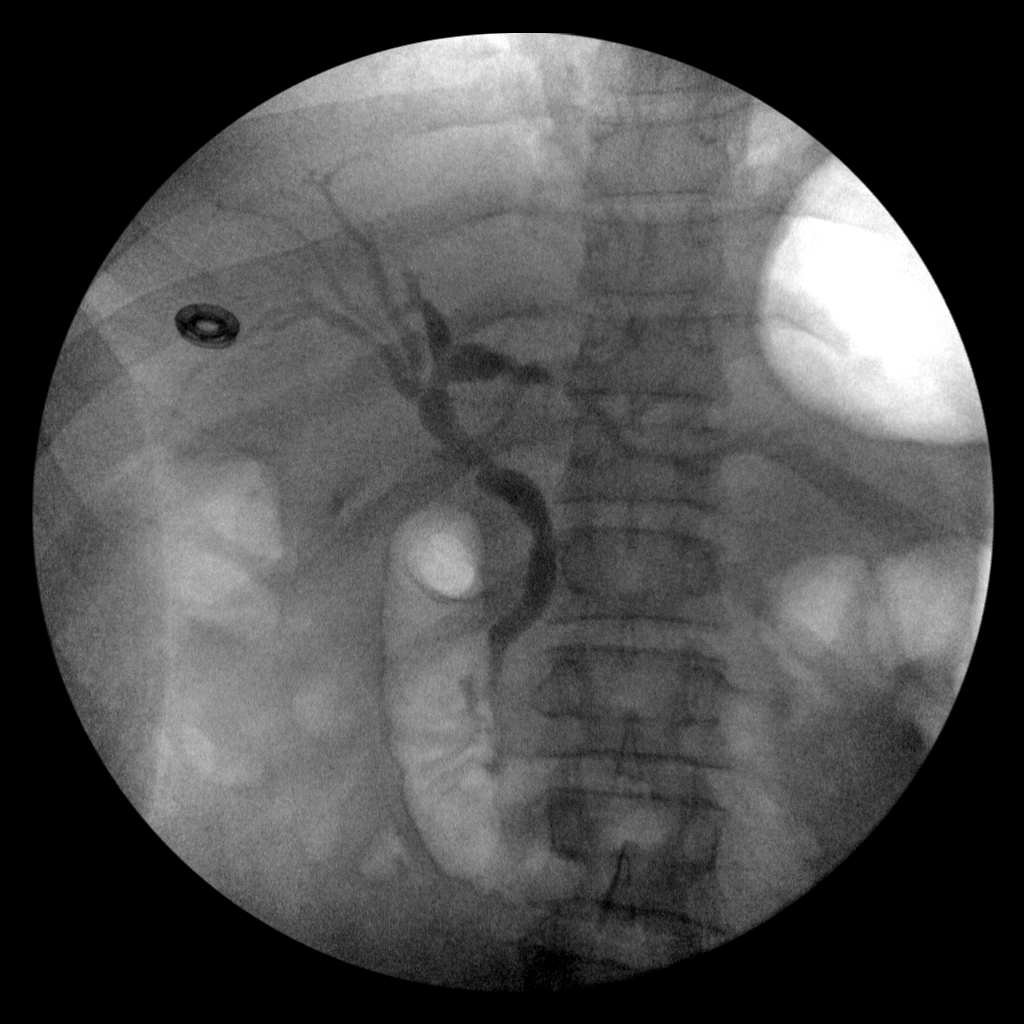

[3 of 3 positions shown; findings below may reference images not displayed]

FINDINGS: 3 fluoroscopic spot images document endoscopic catheterization and
opacification of the CBD. Filling defects in the distal CBD on the
initial image. The intrahepatic bile ducts are incompletely
opacified, mildly distended centrally. Final image shows a
retraction of the scope with no definite residual CBD filling
defects.
IMPRESSION: 1. Choledocholithiasis with endoscopic intervention
These images were submitted for radiologic interpretation only.
Please see the procedural report for the amount of contrast and the
fluoroscopy time utilized.

## 2017-04-11 IMAGING — US US ABDOMEN COMPLETE
1 series · 13 of 25 positions shown · non-contrast
Comparison: None.

CLINICAL DATA: Right upper quadrant pain and increased liver
function tests for 3 weeks.

EXAM:
ULTRASOUND ABDOMEN COMPLETE

[Series 1: us abdomen complete · 0.20mm/px · 13 of 76 slices shown]
[im 1/76]
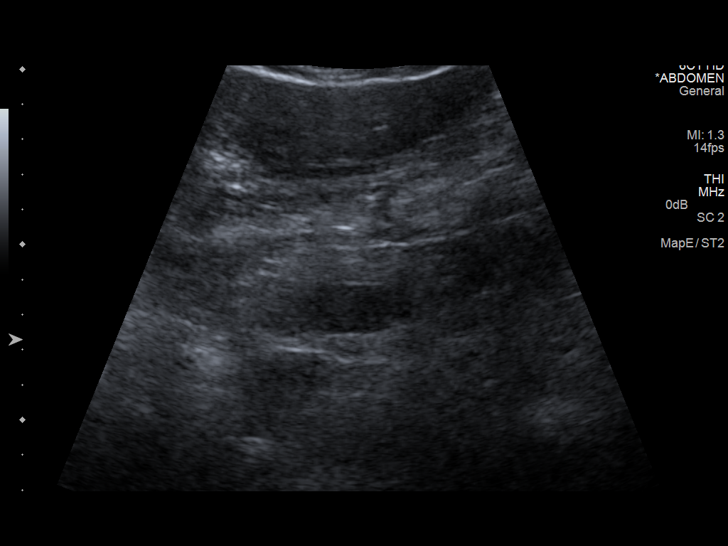
[im 7/76]
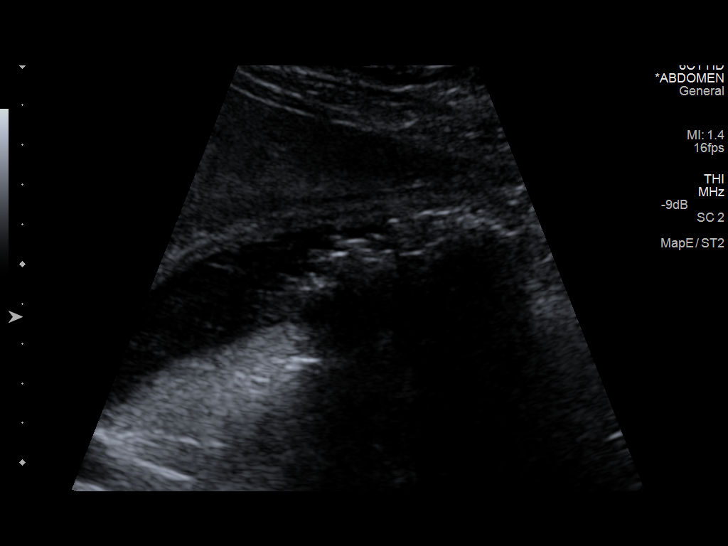
[im 13/76]
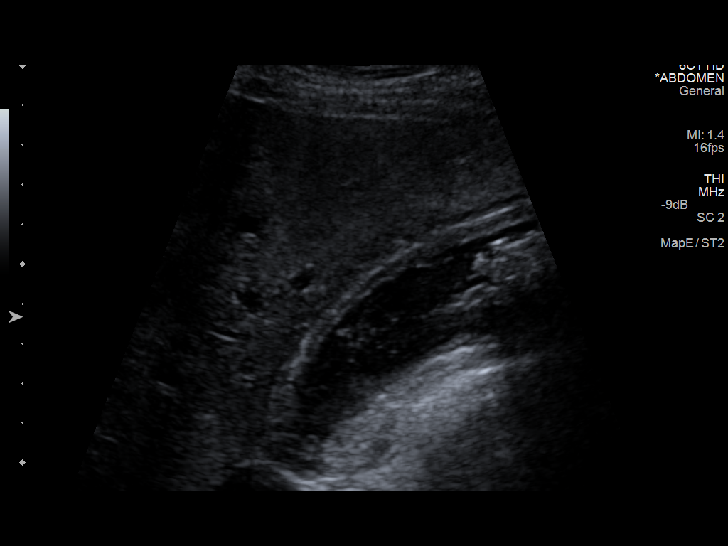
[im 19/76]
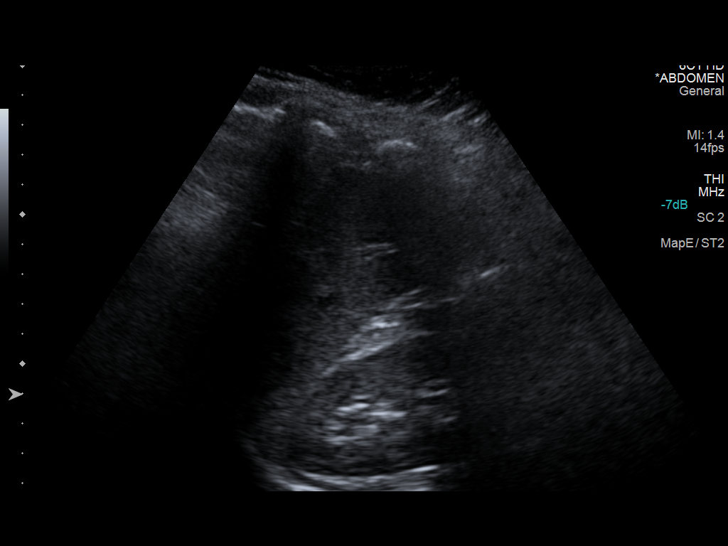
[im 26/76]
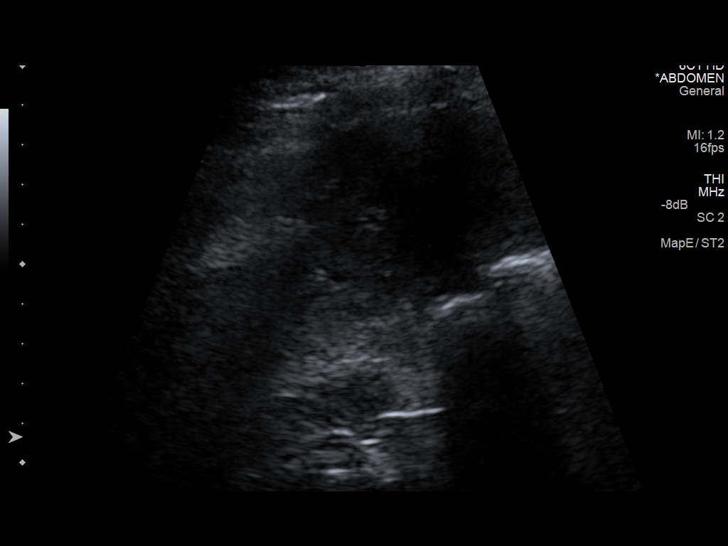
[im 32/76]
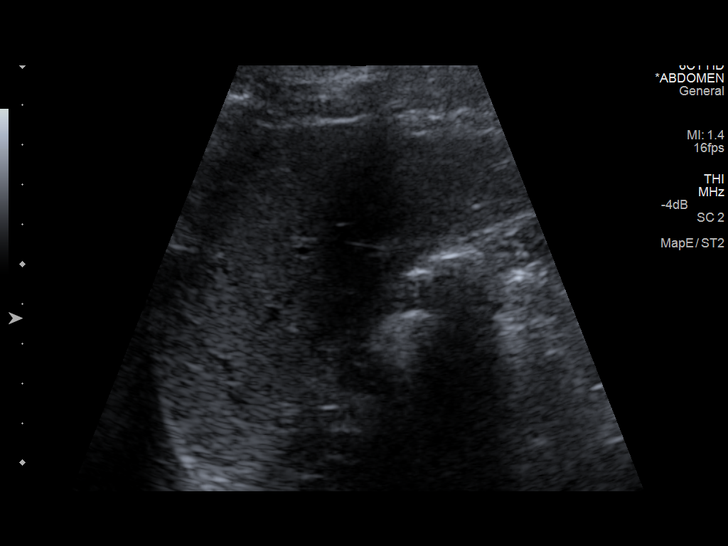
[im 38/76]
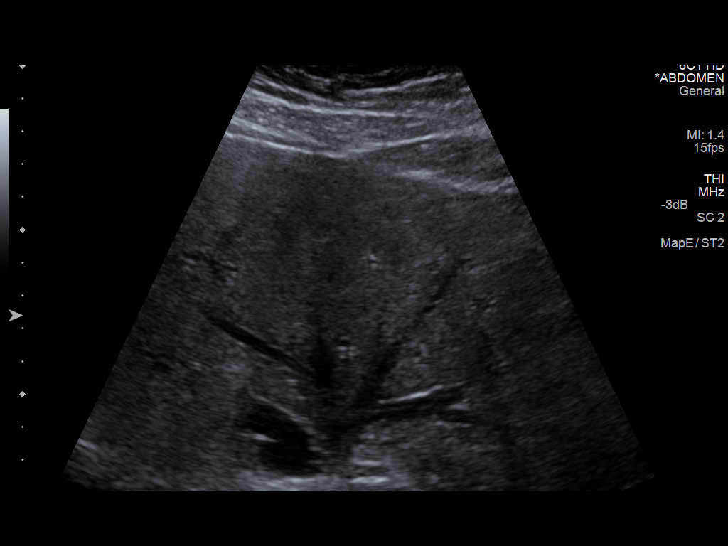
[im 44/76]
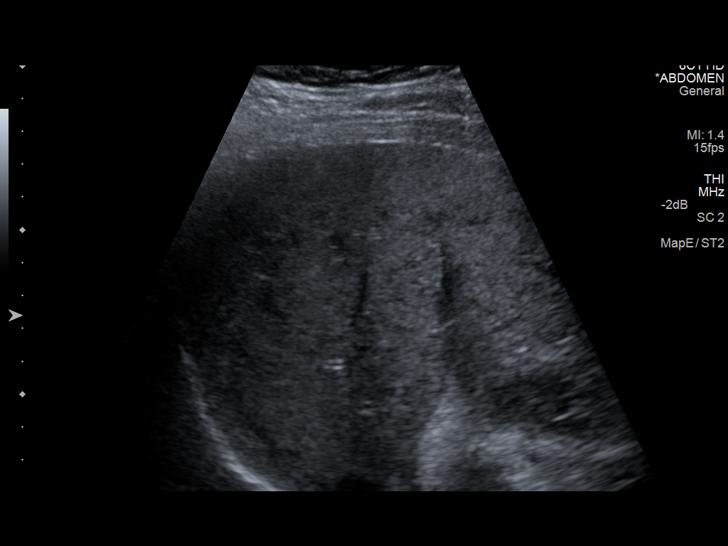
[im 51/76]
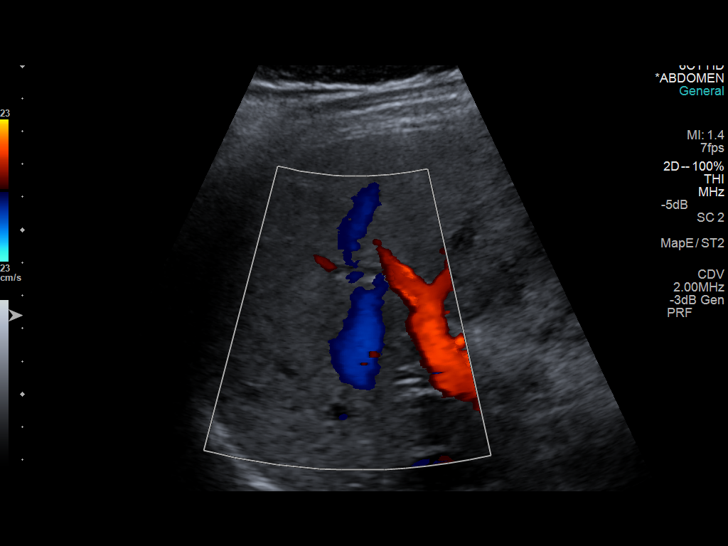
[im 57/76]
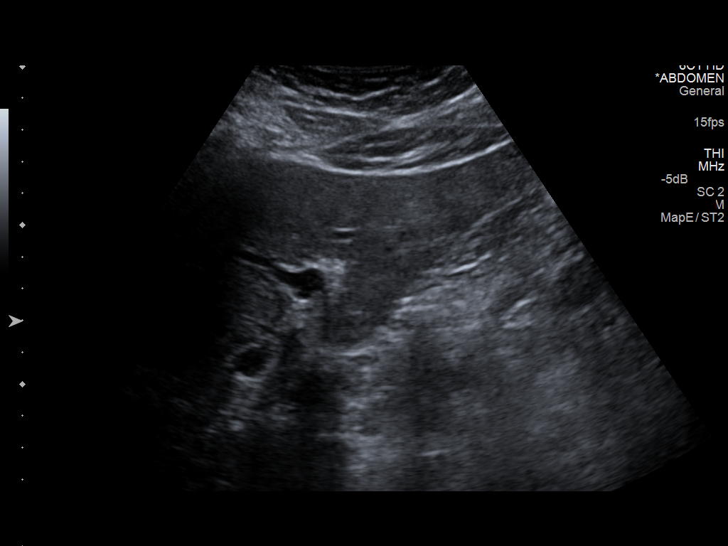
[im 63/76]
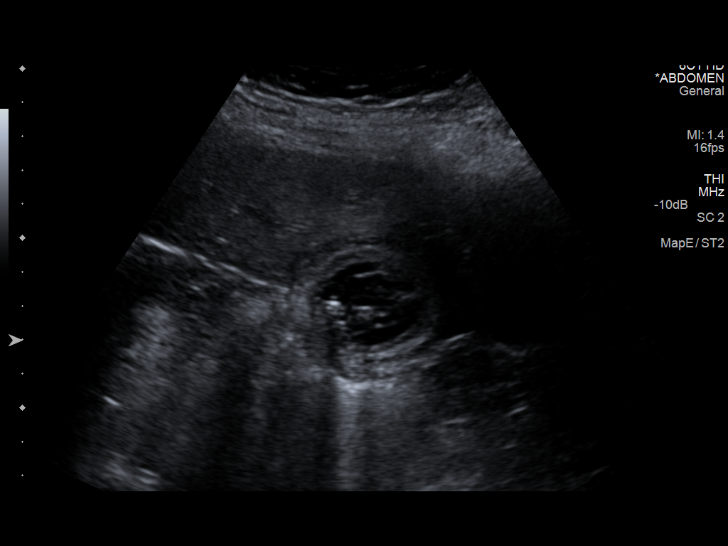
[im 69/76]
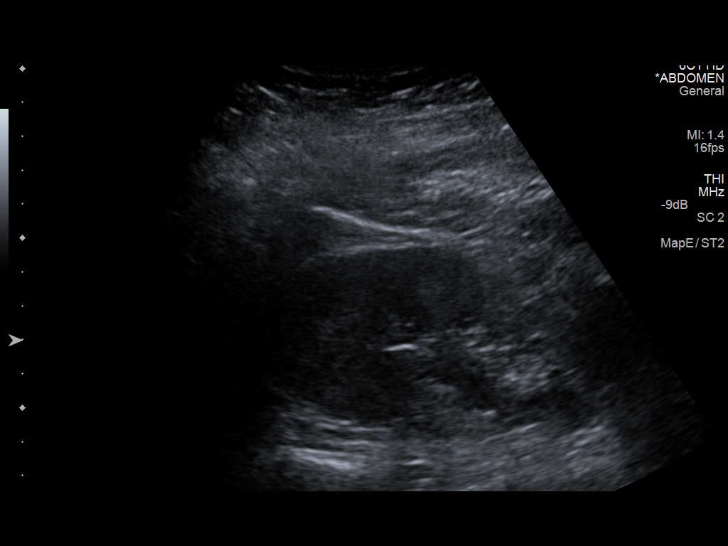
[im 76/76]
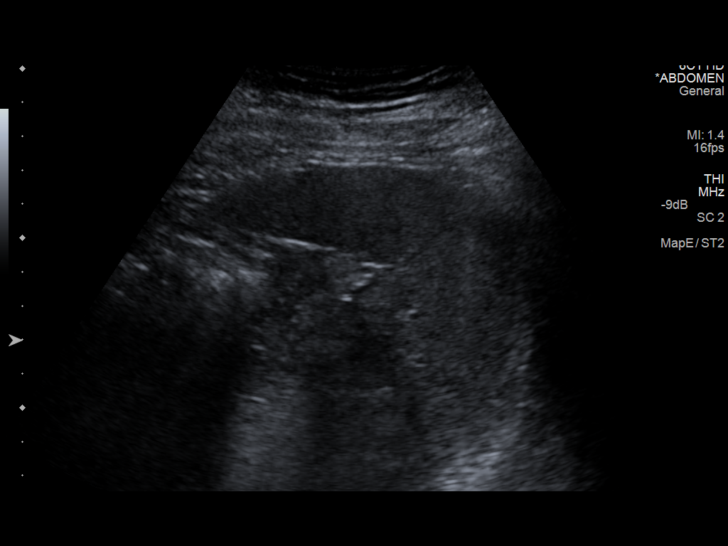

[13 of 25 positions shown; findings below may reference images not displayed]

FINDINGS: Gallbladder: The gallbladder is normally distended, partially filled
with multiple mobile shadowing gallstones measuring up to 6 mm. The
gallbladder wall is thickened and edematous measuring up to 6 mm.
Sonographic Murphy's sign is positive.

Common bile duct: Diameter: Enlarged to 8.5 mm.

Liver: No focal lesion identified. Within normal limits in
parenchymal echogenicity.

IVC: No abnormality visualized.

Pancreas: Visualized portion unremarkable, however partially
obscured by bowel gas.

Spleen: Size and appearance within normal limits.

Right Kidney: Length: 11.9 cm. Echogenicity within normal limits. No
mass or hydronephrosis visualized.

Left Kidney: Length: 11.4 cm. Echogenicity within normal limits. No
mass or hydronephrosis visualized.

Abdominal aorta: Was not seen.

Other findings: None.
IMPRESSION: Cholelithiasis with evidence of acute cholecystitis.

Dilated common bile duct of 8.5 mm, which raises the possibility of
concomitant choledocholithiasis.

These results were called by telephone at the time of interpretation
on 04/14/2015 at [DATE] to Dr. CHY INTERIANO , who verbally
acknowledged these results.

## 2017-07-06 IMAGING — CR DG CHEST 2V
2 series · 2 of 2 positions shown · non-contrast
Comparison: None.

CLINICAL DATA: Acute onset of fever and lower back pain. Initial
encounter.

EXAM:
CHEST  2 VIEW

[w chest pa]
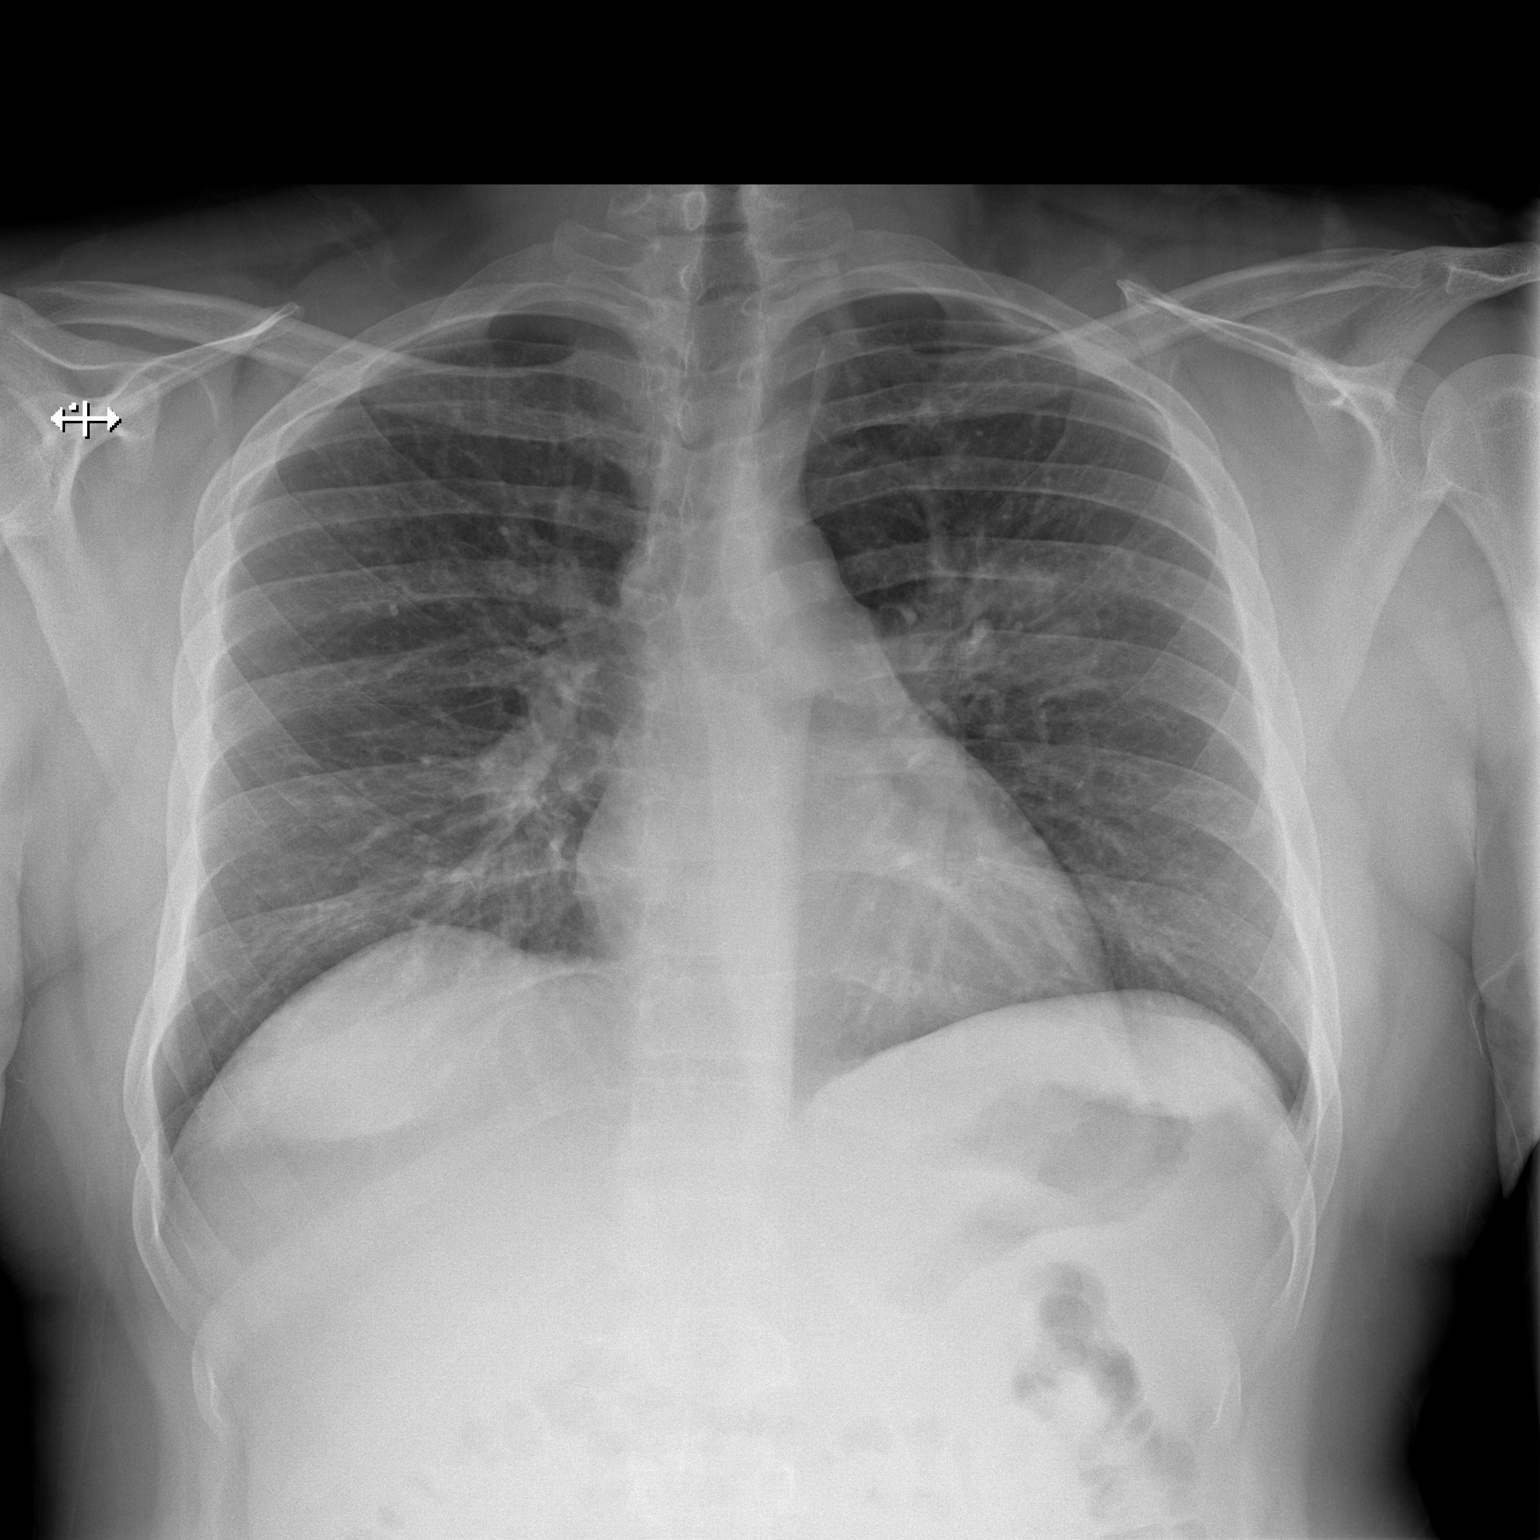

[w chest lat]
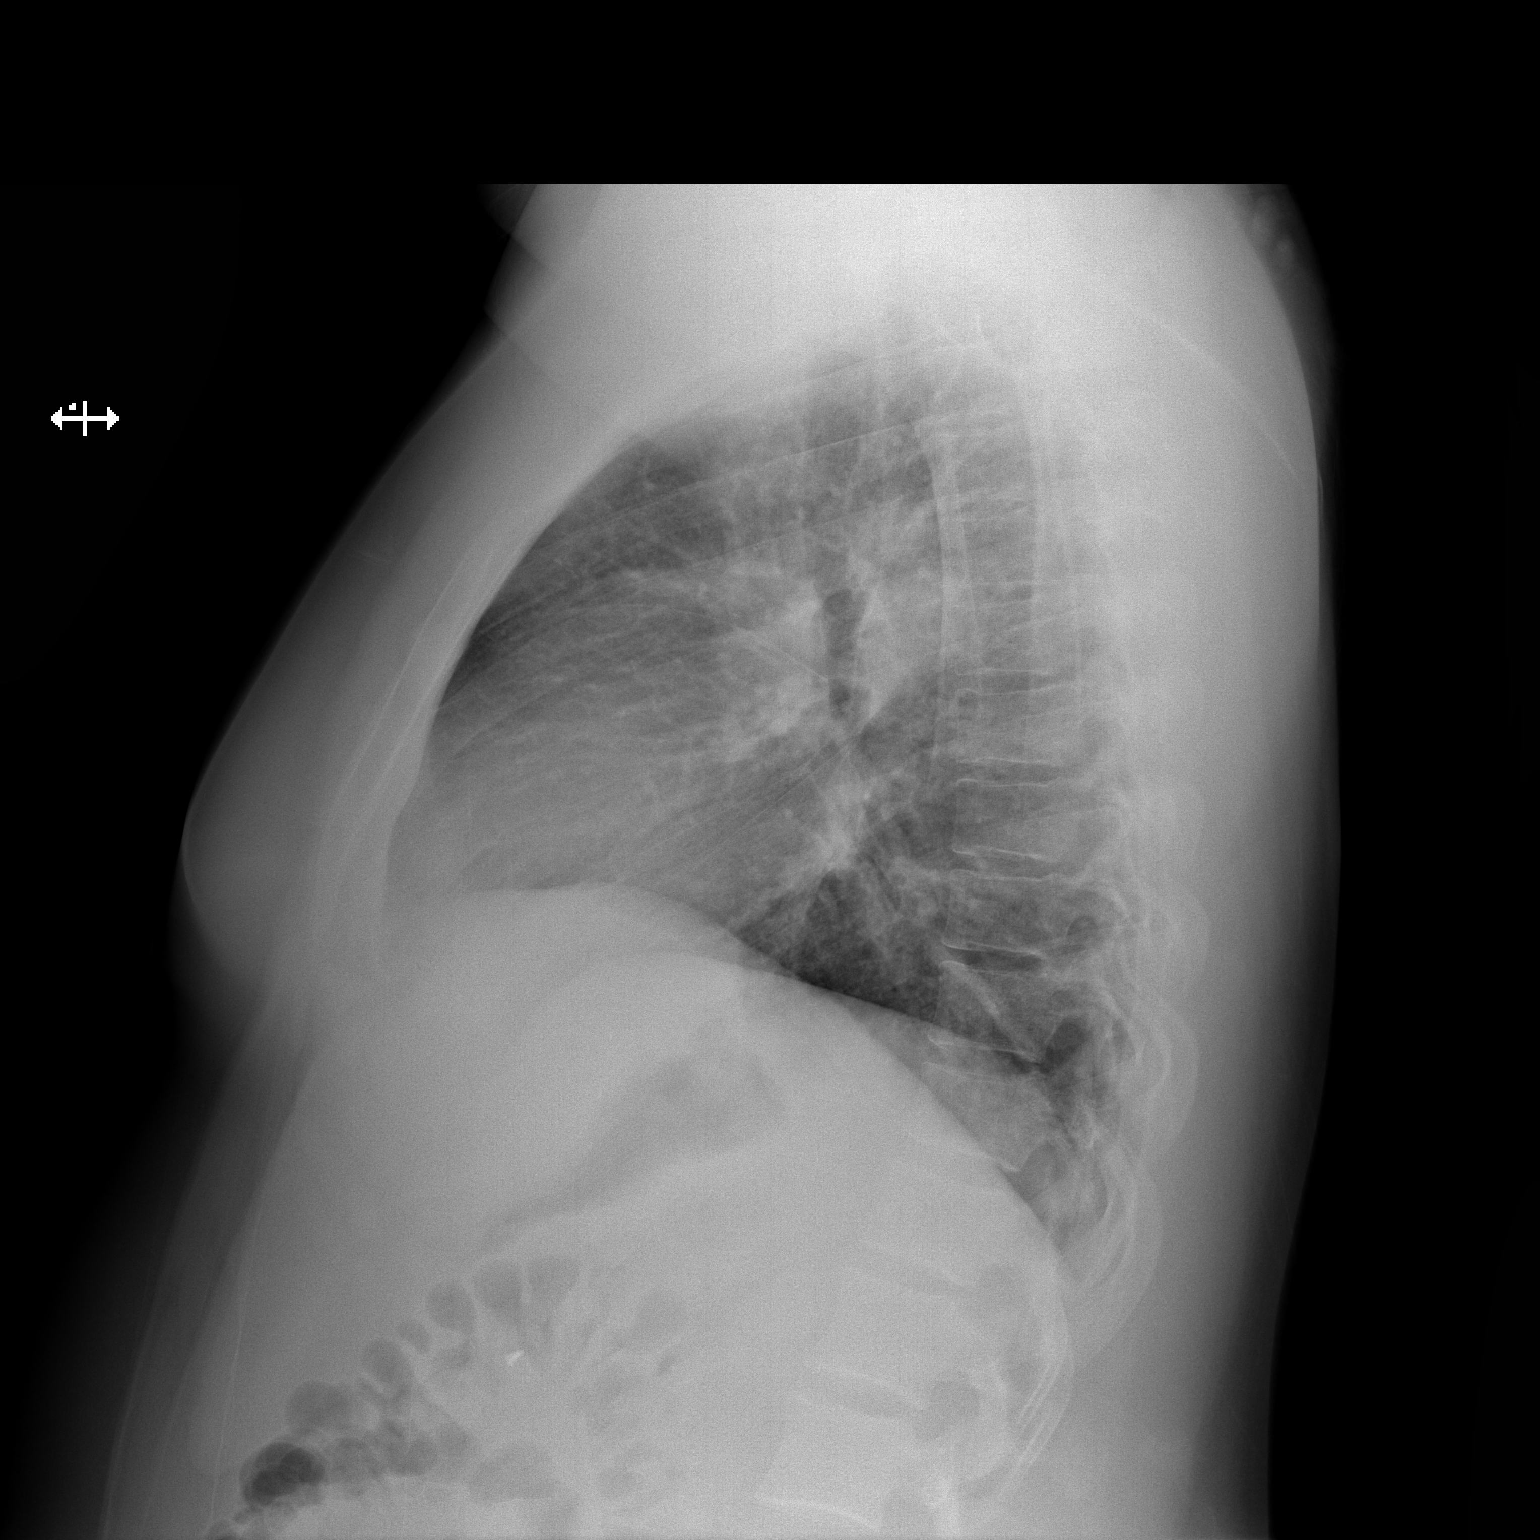

[2 of 2 positions shown; findings below may reference images not displayed]

FINDINGS: The lungs are well-aerated. Left midlung opacity raises concern for
pneumonia. There is no evidence of pleural effusion or pneumothorax.

The heart is normal in size; the mediastinal contour is within
normal limits. No acute osseous abnormalities are seen.
IMPRESSION: Left midlung opacity raises concern for pneumonia.

## 2023-11-06 ENCOUNTER — Ambulatory Visit: Payer: 59 | Admitting: Dermatology

## 2023-11-06 ENCOUNTER — Encounter: Payer: Self-pay | Admitting: Dermatology

## 2023-11-06 DIAGNOSIS — L209 Atopic dermatitis, unspecified: Secondary | ICD-10-CM | POA: Diagnosis not present

## 2023-11-06 DIAGNOSIS — L299 Pruritus, unspecified: Secondary | ICD-10-CM | POA: Diagnosis not present

## 2023-11-06 MED ORDER — CLOBETASOL PROPIONATE 0.05 % EX CREA: 1.0000 | TOPICAL_CREAM | Freq: Two times a day (BID) | CUTANEOUS | 0 refills | Status: DC

## 2023-11-06 MED ORDER — TACROLIMUS 0.1 % EX OINT: TOPICAL_OINTMENT | Freq: Two times a day (BID) | CUTANEOUS | 0 refills | Status: DC

## 2023-11-06 NOTE — Progress Notes (Unsigned)
   New Patient Visit   Subjective  Jason Villarreal is a 33 y.o. male who presents for the following: New Pt - Eczema  Patient states he  has eczema located at the arms that he  would like to have examined. Patient reports the areas have been there for 30 years. He reports the areas are bothersome. Patient rates irritation (itchy) 8 out of 10. He states that the areas have spread. Patient reports he  has not previously been treated for these areas but he was seen at urgent care once while waiting for his NP OV with derm. He has previously used [facility] & prednisone (taken for 1 week). Patient denied Hx of bx. Patient denied family history of skin cancer(s).   The following portions of the chart were reviewed this encounter and updated as appropriate: medications, allergies, medical history  Review of Systems:  No other skin or systemic complaints except as noted in HPI or Assessment and Plan.  Objective  Well appearing patient in no apparent distress; mood and affect are within normal limits.   A focused examination was performed of the following areas: B/L arms   Relevant exam findings are noted in the Assessment and Plan.    Assessment & Plan   ATOPIC DERMATITIS Exam: Scaly pink papules coalescing to plaques ***% BSA  Flared  Atopic dermatitis (eczema) is a chronic, relapsing, pruritic condition that can significantly affect quality of life. It is often associated with allergic rhinitis and/or asthma and can require treatment with topical medications, phototherapy, or in severe cases biologic injectable medication (Dupixent; Adbry) or Oral JAK inhibitors.  Treatment Plan: - Rx Clobetasol  & Tacrolimus  - alternate use every 2 weeks. Apply twice a day.  Recommend gentle skin care.     No follow-ups on file.    Documentation: I have reviewed the above documentation for accuracy and completeness, and I agree with the above.  I, Shirron Louanne Roussel, CMA, am acting as scribe for  [employer], DO.   Louana Roup, DO

## 2023-11-06 NOTE — Patient Instructions (Addendum)
 Hello Jason Villarreal,  Thank you for visiting today. Here is a summary of the key instructions:  - Medications:   - Use clobetasol  twice a day for 2 weeks on all flares   - After clobetasol , use tacrolimus  ointment twice a day for 2 weeks   - For lips: use triamcinolone ointment for up to 7 days   - After triamcinolone, use Aquaphor Body Balm on lips  - Skin Care:   - Moisturize skin regularly   - Continue using Excedrin Advanced Repair   - Use Dove soap   - Use fragrance-free detergents  - Flare Management:   - If clear for 3 weeks then flare, use clobetasol  for about a week until flat and not itchy   - Avoid lip licking to prevent flares  - Follow-up: Return for follow-up appointment in 3 months  We look forward to seeing the positive changes in your next visit. If you have any questions or concerns before then, please do not hesitate to contact our office.  Warm regards,  Dr. Louana Roup, Dermatology   Important Information  Due to recent changes in healthcare laws, you may see results of your pathology and/or laboratory studies on MyChart before the doctors have had a chance to review them. We understand that in some cases there may be results that are confusing or concerning to you. Please understand that not all results are received at the same time and often the doctors may need to interpret multiple results in order to provide you with the best plan of care or course of treatment. Therefore, we ask that you please give us  2 business days to thoroughly review all your results before contacting the office for clarification. Should we see a critical lab result, you will be contacted sooner.   If You Need Anything After Your Visit  If you have any questions or concerns for your doctor, please call our main line at [phone] If no one answers, please leave a voicemail as directed and we will return your call as soon as possible. Messages left after 4 pm will be answered the  following business day.   You may also send us  a message via MyChart. We typically respond to MyChart messages within 1-2 business days.  For prescription refills, please ask your pharmacy to contact our office. Our fax number is [phone].  If you have an urgent issue when the clinic is closed that cannot wait until the next business day, you can page your doctor at the number below.    Please note that while we do our best to be available for urgent issues outside of office hours, we are not available 24/7.   If you have an urgent issue and are unable to reach us , you may choose to seek medical care at your doctor's office, retail clinic, urgent care center, or emergency room.  If you have a medical emergency, please immediately call 911 or go to the emergency department. In the event of inclement weather, please call our main line at [phone] for an update on the status of any delays or closures.  Dermatology Medication Tips: Please keep the boxes that topical medications come in in order to help keep track of the instructions about where and how to use these. Pharmacies typically print the medication instructions only on the boxes and not directly on the medication tubes.   If your medication is too expensive, please contact our office at [phone] or send us  a message through MyChart.  We are unable to tell what your co-pay for medications will be in advance as this is different depending on your insurance coverage. However, we may be able to find a substitute medication at lower cost or fill out paperwork to get insurance to cover a needed medication.   If a prior authorization is required to get your medication covered by your insurance company, please allow us  1-2 business days to complete this process.  Drug prices often vary depending on where the prescription is filled and some pharmacies may offer cheaper prices.  The website www.goodrx.com contains coupons for  medications through different pharmacies. The prices here do not account for what the cost may be with help from insurance (it may be cheaper with your insurance), but the website can give you the price if you did not use any insurance.  - You can print the associated coupon and take it with your prescription to the pharmacy.  - You may also stop by our office during regular business hours and pick up a GoodRx coupon card.  - If you need your prescription sent electronically to a different pharmacy, notify our office through [facility] or by phone at [phone]

## 2024-02-23 ENCOUNTER — Ambulatory Visit: Admitting: Dermatology

## 2024-03-06 ENCOUNTER — Encounter: Payer: Self-pay | Admitting: Dermatology

## 2024-07-29 ENCOUNTER — Ambulatory Visit: Admitting: Dermatology

## 2024-07-29 ENCOUNTER — Encounter: Payer: Self-pay | Admitting: Dermatology

## 2024-07-29 VITALS — BP 141/89

## 2024-07-29 DIAGNOSIS — L209 Atopic dermatitis, unspecified: Secondary | ICD-10-CM

## 2024-07-29 DIAGNOSIS — L299 Pruritus, unspecified: Secondary | ICD-10-CM

## 2024-07-29 MED ORDER — TACROLIMUS 0.1 % EX OINT: TOPICAL_OINTMENT | Freq: Two times a day (BID) | CUTANEOUS | 5 refills | Status: AC

## 2024-07-29 MED ORDER — CLOBETASOL PROPIONATE 0.05 % EX CREA: 1.0000 | TOPICAL_CREAM | Freq: Two times a day (BID) | CUTANEOUS | 5 refills | Status: AC

## 2024-07-29 NOTE — Patient Instructions (Addendum)

## 2024-07-29 NOTE — Progress Notes (Signed)
" ° °  Follow-Up Visit   Subjective  Jason Villarreal is a 34 y.o. male established patient who presents for FOLLOW UP on the diagnoses listed below:  Patient was last evaluated on 11/06/23.   Atopic Derm: Pt stated that he was alternating Tac & clobetasol  BID every 2 weeks. However, he ran out of the Tac but didn't reach out to the office for a refill. He stated prior to running out of Tac the 2 topicals did control Sx but PIH remained.    The following portions of the chart were reviewed this encounter and updated as appropriate: medications, allergies, medical history  Review of Systems:  No other skin or systemic complaints except as noted in HPI or Assessment and Plan.  Objective  Well appearing patient in no apparent distress; mood and affect are within normal limits.   A focused examination was performed of the following areas: B/L arms   Relevant exam findings are noted in the Assessment and Plan.           Assessment & Plan   ATOPIC DERMATITIS and PRURITUS Exam: Scaly pink papules coalescing to plaques 30% BSA  Not at goal  Chronic atopic dermatitis with eczematous plaques on forearms, neck, and face. Symptoms improved with previous treatment but recurred after cessation. Current flare-up likely exacerbated by winter weather. Ankle previously affected but now only shows residual darkness without itching.   - Prescribed clobetasol  twice daily for two weeks, then alternate with tacrolimus  for two weeks. - Recommended using clobetasol  for two weeks during winter to manage chronic eczema, then switch to tacrolimus  unless a severe flare occurs. - Advised using regular lotion in warmer months to maintain control. - Instructed to use Eucerin moisturizer daily, especially with current weather conditions. - Recommended using Dove hypoallergenic soap in the shower. - Advised applying Aquaphor or Vaseline to lock in moisture after medication application. - Provided five  refills for current prescription to ensure year-long supply. - Instructed to return if symptoms worsen or treatment becomes ineffective before next annual visit.    No follow-ups on file.   Documentation: I have reviewed the above documentation for accuracy and completeness, and I agree with the above.  I, Shirron Maranda, CMA II, am acting as scribe for:  Delon Lenis, DO "
